# Patient Record
Sex: Female | Born: 1935 | Race: White | Hispanic: No
Health system: Southern US, Community
[De-identification: ages and names within clinical notes are randomized; demographics above are authoritative.]

## PROBLEM LIST (undated history)

## (undated) DIAGNOSIS — R600 Localized edema: Secondary | ICD-10-CM

## (undated) DIAGNOSIS — I4891 Unspecified atrial fibrillation: Secondary | ICD-10-CM

## (undated) DIAGNOSIS — I10 Essential (primary) hypertension: Secondary | ICD-10-CM

## (undated) DIAGNOSIS — I509 Heart failure, unspecified: Secondary | ICD-10-CM

## (undated) HISTORY — PX: APPENDECTOMY: SHX54

## (undated) HISTORY — PX: CHOLECYSTECTOMY: SHX55

## (undated) HISTORY — DX: Unspecified atrial fibrillation: I48.91

## (undated) HISTORY — PX: ABDOMINAL HYSTERECTOMY: SHX81

## (undated) HISTORY — PX: GLAUCOMA SURGERY: SHX656

## (undated) HISTORY — DX: Essential (primary) hypertension: I10

## (undated) HISTORY — DX: Localized edema: R60.0

---

## 1999-03-13 ENCOUNTER — Ambulatory Visit (HOSPITAL_COMMUNITY): Admission: RE | Admit: 1999-03-13 | Discharge: 1999-03-13 | Payer: Self-pay | Admitting: Cardiovascular Disease

## 1999-08-27 ENCOUNTER — Encounter: Admission: RE | Admit: 1999-08-27 | Discharge: 1999-10-22 | Payer: Self-pay | Admitting: Neurology

## 1999-09-24 ENCOUNTER — Ambulatory Visit (HOSPITAL_COMMUNITY): Admission: RE | Admit: 1999-09-24 | Discharge: 1999-09-24 | Payer: Self-pay | Admitting: Gastroenterology

## 1999-09-24 ENCOUNTER — Encounter (INDEPENDENT_AMBULATORY_CARE_PROVIDER_SITE_OTHER): Payer: Self-pay

## 1999-10-08 ENCOUNTER — Encounter: Payer: Self-pay | Admitting: Gastroenterology

## 1999-10-08 ENCOUNTER — Ambulatory Visit (HOSPITAL_COMMUNITY): Admission: RE | Admit: 1999-10-08 | Discharge: 1999-10-08 | Payer: Self-pay | Admitting: Gastroenterology

## 2001-09-13 ENCOUNTER — Other Ambulatory Visit: Admission: RE | Admit: 2001-09-13 | Discharge: 2001-09-13 | Payer: Self-pay | Admitting: Family Medicine

## 2008-06-12 HISTORY — PX: US ECHOCARDIOGRAPHY: HXRAD669

## 2010-06-21 ENCOUNTER — Ambulatory Visit: Payer: Self-pay | Admitting: Cardiovascular Disease

## 2010-07-11 ENCOUNTER — Ambulatory Visit: Payer: Self-pay | Admitting: Cardiovascular Disease

## 2010-09-24 ENCOUNTER — Ambulatory Visit: Payer: Self-pay | Admitting: Cardiovascular Disease

## 2010-12-25 ENCOUNTER — Ambulatory Visit (INDEPENDENT_AMBULATORY_CARE_PROVIDER_SITE_OTHER): Payer: Self-pay | Admitting: Cardiovascular Disease

## 2010-12-25 DIAGNOSIS — I119 Hypertensive heart disease without heart failure: Secondary | ICD-10-CM

## 2011-02-03 ENCOUNTER — Telehealth: Payer: Self-pay | Admitting: Cardiovascular Disease

## 2011-02-03 ENCOUNTER — Telehealth: Payer: Self-pay | Admitting: *Deleted

## 2011-02-03 NOTE — Telephone Encounter (Signed)
QUESTIONS ABOUT MEDICATIONS °

## 2011-02-03 NOTE — Telephone Encounter (Signed)
Pt thinks Losartan is causing depression and fatigue.  States this started when the Losartan was started.  She states she cannot take Metoprolol due to the same reasons, and also cannot tolerate Norvasc due to swelling.  RN will ask Dr. Elease Hashimoto for further eval.

## 2011-02-04 ENCOUNTER — Telehealth: Payer: Self-pay | Admitting: Cardiovascular Disease

## 2011-02-04 NOTE — Telephone Encounter (Signed)
Dr Elease Hashimoto to address med intolerance, losartan, metoprolol,and norvasc. Makes pt fatigued and depressed. Left msg for her to call and make app. # left on machine. jodette Elizaveta Mattice rn

## 2011-04-22 ENCOUNTER — Encounter: Payer: Self-pay | Admitting: Cardiovascular Disease

## 2011-10-14 ENCOUNTER — Other Ambulatory Visit: Payer: Self-pay | Admitting: Cardiovascular Disease

## 2011-10-23 NOTE — Telephone Encounter (Signed)
Fax Received. Refill Completed. Wendy Patterson (R.M.A)   

## 2011-10-23 NOTE — Telephone Encounter (Signed)
Hey, just a heads up, this patient refill has been on hold since 12/11.

## 2011-11-03 ENCOUNTER — Encounter: Payer: Self-pay | Admitting: Cardiovascular Disease

## 2011-11-03 ENCOUNTER — Encounter: Payer: Self-pay | Admitting: *Deleted

## 2011-11-18 ENCOUNTER — Encounter: Payer: Self-pay | Admitting: Cardiovascular Disease

## 2011-11-18 ENCOUNTER — Ambulatory Visit (INDEPENDENT_AMBULATORY_CARE_PROVIDER_SITE_OTHER): Payer: 59 | Admitting: Cardiovascular Disease

## 2011-11-18 VITALS — BP 133/55 | HR 68 | Ht 66.0 in | Wt 118.8 lb

## 2011-11-18 DIAGNOSIS — I10 Essential (primary) hypertension: Secondary | ICD-10-CM | POA: Insufficient documentation

## 2011-11-18 LAB — BASIC METABOLIC PANEL
CO2: 24 mEq/L (ref 19–32)
Chloride: 103 mEq/L (ref 96–112)
Creatinine, Ser: 0.9 mg/dL (ref 0.4–1.2)
Potassium: 3.5 mEq/L (ref 3.5–5.1)
Sodium: 137 mEq/L (ref 135–145)

## 2011-11-18 MED ORDER — CARVEDILOL 12.5 MG PO TABS: 12.5000 mg | ORAL_TABLET | Freq: Two times a day (BID) | ORAL | Status: DC

## 2011-11-18 MED ORDER — COREG 12.5 MG PO TABS: 12.5000 mg | ORAL_TABLET | Freq: Two times a day (BID) | ORAL | Status: DC

## 2011-11-18 NOTE — Patient Instructions (Addendum)
Your physician wants you to follow-up in: 3 MONTHS  You will receive a reminder letter in the mail two months in advance. If you don't receive a letter, please call our office to schedule the follow-up appointment.  Your physician recommends that you return for lab work in: TODAY BMP  STOP LOSARTAN START CARVEDILOL/COREG 12.5 MG ONE TABLET TWICE A DAY

## 2011-11-18 NOTE — Progress Notes (Signed)
    Wendall Mola Date of Birth  1931-03-31 [facility]     [place] Office  [address]    [address]   [address] [place], [place]  [address]    [place], [place]  [address] [phone]  Fax  [phone]  [phone]  Fax [phone]   History of Present Illness:  Wendy Patterson is a 78 yo with a hx of HTN.  She has had lots of fatigue - especially after starting the Losartan.  She even thinks her fatigue is worse than when she was on Metoprolol.  Current Outpatient Prescriptions on File Prior to Visit  Medication Sig Dispense Refill  . aspirin 325 MG tablet Take 325 mg by mouth daily.        . Brimonidine Tartrate (ALPHAGAN P OP) Apply to eye 2 (two) times daily. Left Eye      . bumetanide (BUMEX) 2 MG tablet Take 2 mg by mouth daily.        . Cyanocobalamin (VITAMIN B12 PO) Take by mouth daily.        Marland Kitchen estrogens, conjugated, (PREMARIN) 1.25 MG tablet Take 1.25 mg by mouth. Every Other Day      . Latanoprost (XALATAN OP) Apply to eye. Left Eye       . losartan (COZAAR) 100 MG tablet TAKE ONE (1) TABLET BY MOUTH EVERY DAY  30 tablet  0  . Pyridoxine HCl (VITAMIN B6 PO) Take by mouth daily.        . Vitamin D, Ergocalciferol, (DRISDOL) 50000 UNITS CAPS Take 50,000 Units by mouth. Every Other Week        Allergies  Allergen Reactions  . Amlodipine     Caused significant leg edema   . Oxycontin     Dizziness and nausea   . Sulfa Drugs Cross Reactors     Cause Asthma    Past Medical History  Diagnosis Date  . HTN (hypertension)   . Atrial fibrillation     Intermittent/atrial flutter  . Glaucoma   . Leg edema     Past Surgical History  Procedure Date  . Glaucoma surgery   . US echocardiography 06-12-2008    EF 55-60%    History  Smoking status  . Never Smoker   Smokeless tobacco  . Not on file    History  Alcohol Use: Not on file    No family history on file.  Reviw of Systems:  Reviewed in the HPI.  All other systems are  negative.  Physical Exam: BP 133/55  Pulse 68  Ht 5\' 6"  (1.676 m)  Wt 118 lb 12.8 oz (53.887 kg)  BMI 19.17 kg/m2 The patient is alert and oriented x 3.  The mood and affect are normal.   Skin: warm and dry.  Color is normal.    HEENT:   She has 2+ carotids. There is no JVD. Her neck is supple.  Lungs: Lungs are clear to auscultation.   Heart: Regular rate S1-S2.    Abdomen: Good bowel sounds. There is no hepatosplenomegaly.  Extremities:  No clubbing cyanosis or edema.  Neuro:  There exam is nonfocal    ECG: Normal sinus rhythm. She has normal EKG  Assessment / Plan:

## 2011-11-18 NOTE — Assessment & Plan Note (Signed)
Wendy Patterson complains of fatigue which may be due to the losartan. She seems to tolerate many medicines very poorly. We will start her back on the metoprolol 25 mg twice a day to see if that helps. I'll see her back in several months for followup visit.

## 2011-12-03 ENCOUNTER — Telehealth: Payer: Self-pay | Admitting: Cardiovascular Disease

## 2011-12-03 NOTE — Telephone Encounter (Signed)
Pt needs 11-18-11 lab work faxed to dr [employer] fax [phone], waiting on rx from his office and cant get it until he gets labs

## 2011-12-03 NOTE — Telephone Encounter (Signed)
Labs forwarded to pcp

## 2013-06-07 ENCOUNTER — Other Ambulatory Visit: Payer: Self-pay | Admitting: Family Medicine

## 2013-06-07 DIAGNOSIS — R141 Gas pain: Secondary | ICD-10-CM

## 2013-06-07 DIAGNOSIS — R1907 Generalized intra-abdominal and pelvic swelling, mass and lump: Secondary | ICD-10-CM

## 2013-06-07 DIAGNOSIS — R1900 Intra-abdominal and pelvic swelling, mass and lump, unspecified site: Secondary | ICD-10-CM

## 2013-06-07 DIAGNOSIS — R109 Unspecified abdominal pain: Secondary | ICD-10-CM

## 2013-06-13 ENCOUNTER — Other Ambulatory Visit: Payer: Self-pay | Admitting: Family Medicine

## 2013-06-13 ENCOUNTER — Other Ambulatory Visit: Payer: 59

## 2013-06-13 DIAGNOSIS — R143 Flatulence: Secondary | ICD-10-CM

## 2013-06-13 DIAGNOSIS — R1903 Right lower quadrant abdominal swelling, mass and lump: Secondary | ICD-10-CM

## 2013-06-14 ENCOUNTER — Ambulatory Visit
Admission: RE | Admit: 2013-06-14 | Discharge: 2013-06-14 | Disposition: A | Discharge status: Home / Self Care | Payer: 59 | Source: Ambulatory Visit | Attending: Family Medicine | Admitting: Family Medicine

## 2013-06-14 DIAGNOSIS — R143 Flatulence: Secondary | ICD-10-CM

## 2013-06-14 DIAGNOSIS — R1903 Right lower quadrant abdominal swelling, mass and lump: Secondary | ICD-10-CM

## 2014-01-06 ENCOUNTER — Other Ambulatory Visit: Payer: Self-pay | Admitting: Family Medicine

## 2014-01-06 DIAGNOSIS — M542 Cervicalgia: Secondary | ICD-10-CM

## 2014-01-06 DIAGNOSIS — S0990XA Unspecified injury of head, initial encounter: Secondary | ICD-10-CM

## 2014-02-22 ENCOUNTER — Ambulatory Visit: Payer: Medicare Other | Attending: Family Medicine

## 2014-02-22 DIAGNOSIS — IMO0001 Reserved for inherently not codable concepts without codable children: Secondary | ICD-10-CM | POA: Insufficient documentation

## 2014-02-22 DIAGNOSIS — R293 Abnormal posture: Secondary | ICD-10-CM | POA: Insufficient documentation

## 2014-02-22 DIAGNOSIS — R279 Unspecified lack of coordination: Secondary | ICD-10-CM | POA: Diagnosis not present

## 2014-02-27 ENCOUNTER — Ambulatory Visit: Payer: Medicare Other | Admitting: Physical Therapy

## 2014-02-27 DIAGNOSIS — IMO0001 Reserved for inherently not codable concepts without codable children: Secondary | ICD-10-CM | POA: Diagnosis not present

## 2014-03-01 ENCOUNTER — Ambulatory Visit: Payer: Medicare Other | Admitting: Physical Therapy

## 2014-03-01 DIAGNOSIS — IMO0001 Reserved for inherently not codable concepts without codable children: Secondary | ICD-10-CM | POA: Diagnosis not present

## 2014-03-06 ENCOUNTER — Ambulatory Visit: Payer: Medicare Other | Attending: Family Medicine | Admitting: Physical Therapy

## 2014-03-06 DIAGNOSIS — R293 Abnormal posture: Secondary | ICD-10-CM | POA: Diagnosis not present

## 2014-03-06 DIAGNOSIS — IMO0001 Reserved for inherently not codable concepts without codable children: Secondary | ICD-10-CM | POA: Diagnosis present

## 2014-03-06 DIAGNOSIS — R279 Unspecified lack of coordination: Secondary | ICD-10-CM | POA: Diagnosis not present

## 2014-03-09 ENCOUNTER — Ambulatory Visit: Payer: Medicare Other

## 2014-03-09 DIAGNOSIS — IMO0001 Reserved for inherently not codable concepts without codable children: Secondary | ICD-10-CM | POA: Diagnosis not present

## 2014-03-13 ENCOUNTER — Ambulatory Visit: Payer: Medicare Other

## 2014-03-13 DIAGNOSIS — IMO0001 Reserved for inherently not codable concepts without codable children: Secondary | ICD-10-CM | POA: Diagnosis not present

## 2014-03-17 ENCOUNTER — Ambulatory Visit: Payer: Medicare Other

## 2014-03-17 DIAGNOSIS — IMO0001 Reserved for inherently not codable concepts without codable children: Secondary | ICD-10-CM | POA: Diagnosis not present

## 2014-03-20 ENCOUNTER — Ambulatory Visit: Payer: Medicare Other

## 2014-03-20 DIAGNOSIS — IMO0001 Reserved for inherently not codable concepts without codable children: Secondary | ICD-10-CM | POA: Diagnosis not present

## 2014-03-23 ENCOUNTER — Ambulatory Visit: Payer: Medicare Other

## 2014-03-23 DIAGNOSIS — IMO0001 Reserved for inherently not codable concepts without codable children: Secondary | ICD-10-CM | POA: Diagnosis not present

## 2014-03-28 ENCOUNTER — Ambulatory Visit: Payer: Medicare Other

## 2014-03-28 DIAGNOSIS — IMO0001 Reserved for inherently not codable concepts without codable children: Secondary | ICD-10-CM | POA: Diagnosis not present

## 2014-03-31 ENCOUNTER — Ambulatory Visit: Payer: Medicare Other | Admitting: Physical Therapy

## 2014-03-31 DIAGNOSIS — IMO0001 Reserved for inherently not codable concepts without codable children: Secondary | ICD-10-CM | POA: Diagnosis not present

## 2014-04-04 ENCOUNTER — Ambulatory Visit: Payer: Medicare Other | Attending: Family Medicine

## 2014-04-04 DIAGNOSIS — R279 Unspecified lack of coordination: Secondary | ICD-10-CM | POA: Insufficient documentation

## 2014-04-04 DIAGNOSIS — R293 Abnormal posture: Secondary | ICD-10-CM | POA: Diagnosis not present

## 2014-04-04 DIAGNOSIS — IMO0001 Reserved for inherently not codable concepts without codable children: Secondary | ICD-10-CM | POA: Diagnosis present

## 2014-04-07 ENCOUNTER — Ambulatory Visit: Payer: Medicare Other | Admitting: Physical Therapy

## 2014-04-07 DIAGNOSIS — IMO0001 Reserved for inherently not codable concepts without codable children: Secondary | ICD-10-CM | POA: Diagnosis not present

## 2014-04-11 ENCOUNTER — Ambulatory Visit: Payer: Medicare Other | Admitting: Physical Therapy

## 2014-04-11 DIAGNOSIS — IMO0001 Reserved for inherently not codable concepts without codable children: Secondary | ICD-10-CM | POA: Diagnosis not present

## 2014-04-14 ENCOUNTER — Ambulatory Visit: Payer: Medicare Other | Admitting: Physical Therapy

## 2014-04-14 DIAGNOSIS — IMO0001 Reserved for inherently not codable concepts without codable children: Secondary | ICD-10-CM | POA: Diagnosis not present

## 2014-04-19 ENCOUNTER — Ambulatory Visit: Payer: Medicare Other

## 2014-04-19 ENCOUNTER — Ambulatory Visit: Payer: 59 | Admitting: Physical Therapy

## 2014-04-19 DIAGNOSIS — IMO0001 Reserved for inherently not codable concepts without codable children: Secondary | ICD-10-CM | POA: Diagnosis not present

## 2014-04-21 ENCOUNTER — Ambulatory Visit: Payer: Medicare Other | Admitting: Physical Therapy

## 2014-04-21 DIAGNOSIS — IMO0001 Reserved for inherently not codable concepts without codable children: Secondary | ICD-10-CM | POA: Diagnosis not present

## 2015-04-27 DIAGNOSIS — K219 Gastro-esophageal reflux disease without esophagitis: Secondary | ICD-10-CM | POA: Insufficient documentation

## 2015-04-27 DIAGNOSIS — I4891 Unspecified atrial fibrillation: Secondary | ICD-10-CM | POA: Insufficient documentation

## 2016-06-20 ENCOUNTER — Other Ambulatory Visit: Payer: Self-pay | Admitting: Family Medicine

## 2016-06-20 ENCOUNTER — Ambulatory Visit
Admission: RE | Admit: 2016-06-20 | Discharge: 2016-06-20 | Disposition: A | Discharge status: Home / Self Care | Payer: Medicare Other | Source: Ambulatory Visit | Attending: Family Medicine | Admitting: Family Medicine

## 2016-06-20 DIAGNOSIS — R0609 Other forms of dyspnea: Principal | ICD-10-CM

## 2016-07-01 ENCOUNTER — Other Ambulatory Visit (HOSPITAL_COMMUNITY): Payer: Self-pay

## 2016-07-15 ENCOUNTER — Ambulatory Visit (HOSPITAL_COMMUNITY): Payer: Medicare Other | Attending: Cardiology

## 2016-07-15 ENCOUNTER — Other Ambulatory Visit: Payer: Self-pay

## 2016-07-15 ENCOUNTER — Other Ambulatory Visit: Payer: Self-pay | Admitting: Family Medicine

## 2016-07-15 DIAGNOSIS — I340 Nonrheumatic mitral (valve) insufficiency: Secondary | ICD-10-CM | POA: Diagnosis not present

## 2016-07-15 DIAGNOSIS — I119 Hypertensive heart disease without heart failure: Secondary | ICD-10-CM | POA: Insufficient documentation

## 2016-07-15 DIAGNOSIS — I071 Rheumatic tricuspid insufficiency: Secondary | ICD-10-CM | POA: Insufficient documentation

## 2016-07-15 DIAGNOSIS — I351 Nonrheumatic aortic (valve) insufficiency: Secondary | ICD-10-CM | POA: Insufficient documentation

## 2016-07-15 DIAGNOSIS — R0600 Dyspnea, unspecified: Secondary | ICD-10-CM | POA: Diagnosis present

## 2016-07-15 DIAGNOSIS — R0609 Other forms of dyspnea: Principal | ICD-10-CM

## 2016-08-21 ENCOUNTER — Encounter: Payer: Self-pay | Admitting: Internal Medicine

## 2016-08-21 ENCOUNTER — Other Ambulatory Visit (INDEPENDENT_AMBULATORY_CARE_PROVIDER_SITE_OTHER): Payer: Medicare Other

## 2016-08-21 ENCOUNTER — Ambulatory Visit (INDEPENDENT_AMBULATORY_CARE_PROVIDER_SITE_OTHER): Payer: Medicare Other | Admitting: Internal Medicine

## 2016-08-21 VITALS — BP 138/82 | HR 90 | Ht 63.0 in | Wt 105.4 lb

## 2016-08-21 DIAGNOSIS — J309 Allergic rhinitis, unspecified: Secondary | ICD-10-CM

## 2016-08-21 DIAGNOSIS — R0602 Shortness of breath: Secondary | ICD-10-CM | POA: Insufficient documentation

## 2016-08-21 LAB — CBC WITH DIFFERENTIAL/PLATELET
BASOS PCT: 1.1 % (ref 0.0–3.0)
Basophils Absolute: 0.1 10*3/uL (ref 0.0–0.1)
EOS PCT: 1 % (ref 0.0–5.0)
Eosinophils Absolute: 0.1 10*3/uL (ref 0.0–0.7)
HEMATOCRIT: 35.9 % — AB (ref 36.0–46.0)
HEMOGLOBIN: 12.1 g/dL (ref 12.0–15.0)
Lymphocytes Relative: 20.6 % (ref 12.0–46.0)
Lymphs Abs: 1.8 10*3/uL (ref 0.7–4.0)
MCHC: 33.8 g/dL (ref 30.0–36.0)
MCV: 101.5 fl — ABNORMAL HIGH (ref 78.0–100.0)
MONO ABS: 0.5 10*3/uL (ref 0.1–1.0)
Monocytes Relative: 6.3 % (ref 3.0–12.0)
NEUTROS ABS: 6.2 10*3/uL (ref 1.4–7.7)
Neutrophils Relative %: 71 % (ref 43.0–77.0)
PLATELETS: 179 10*3/uL (ref 150.0–400.0)
RBC: 3.54 Mil/uL — ABNORMAL LOW (ref 3.87–5.11)
RDW: 15.7 % — AB (ref 11.5–15.5)
WBC: 8.7 10*3/uL (ref 4.0–10.5)

## 2016-08-21 LAB — BASIC METABOLIC PANEL
BUN: 28 mg/dL — AB (ref 6–23)
CHLORIDE: 99 meq/L (ref 96–112)
CO2: 25 meq/L (ref 19–32)
CREATININE: 1.08 mg/dL (ref 0.40–1.20)
Calcium: 8.4 mg/dL (ref 8.4–10.5)
GFR: 51.46 mL/min — ABNORMAL LOW (ref >60.00)
GLUCOSE: 107 mg/dL — AB (ref 70–99)
Potassium: 4.2 mEq/L (ref 3.5–5.1)
Sodium: 134 mEq/L — ABNORMAL LOW (ref 135–145)

## 2016-08-21 LAB — HEPATIC FUNCTION PANEL
ALT: 12 U/L (ref 0–35)
AST: 27 U/L (ref 0–37)
Albumin: 3.8 g/dL (ref 3.5–5.2)
Alkaline Phosphatase: 68 U/L (ref 39–117)
BILIRUBIN TOTAL: 0.5 mg/dL (ref 0.2–1.2)
Bilirubin, Direct: 0.1 mg/dL (ref 0.0–0.3)
TOTAL PROTEIN: 7.9 g/dL (ref 6.0–8.3)

## 2016-08-21 LAB — TSH: TSH: 2.07 u[IU]/mL (ref 0.35–4.50)

## 2016-08-21 LAB — BRAIN NATRIURETIC PEPTIDE: Pro B Natriuretic peptide (BNP): 964 pg/mL — ABNORMAL HIGH (ref 0.0–100.0)

## 2016-08-21 LAB — D-DIMER, QUANTITATIVE: D-Dimer, Quant: 0.75 mcg/mL FEU — ABNORMAL HIGH (ref <0.50)

## 2016-08-21 NOTE — Progress Notes (Signed)
Subjective:    Patient ID: Wendy Patterson, female    DOB: 04/13/1931    MRN: [id number]  HPI  [age] yowf never smoker first developed upper airway allergies seasonally in her in 1950's first symptoms and shots 1971 and last on them in 2011 by Kozlow but persistent spring = fall itchy sneezing  Runny nose but never rx inhalers referred to pulmonary clinic 08/21/2016 by Dr   Jeannetta NapElkins with doe x 2015   08/21/2016 1st [facility] office visit/ Remus Hagedorn   Chief Complaint  Patient presents with  . PULMONARY CONSULT    Abnormal Cleda DaubSpiro, referred by Dr Jeannetta NapElkins. Pt c/o SOB x 1-2 years; worsening. Severe ankle/feet swelling/blue in color for several months. Pt denies pain in legs from swelling.   indolent onset of doe x 2 years progressively worse but never present sitting or sleeping  Assoc fatigue and bilateral leg swelling L > R x " long time" before the sob but worse since onset of sob New L cp x sev months  Occurs transiently usually  with sitting and not walking/ not pleuritic /not supine  Last time able to walk at  HT x one month prior to OV     No obvious day to day or daytime variability or assoc excess/ purulent sputum or mucus plugs or hemoptysis or   chest tightness, subjective wheeze or overt sinus or hb symptoms. No unusual exp hx or h/o childhood pna/ asthma or knowledge of premature birth.  Sleeping ok without nocturnal  or early am exacerbation  of respiratory  c/o's or need for noct saba. Also denies any obvious fluctuation of symptoms with weather or environmental changes or other aggravating or alleviating factors except as outlined above   Current Medications, Allergies, Complete Past Medical History, Past Surgical History, Family History, and Social History were reviewed in [facility] electronic medical record.  ROS  The following are not active complaints unless bolded sore throat, dysphagia, dental problems, itching, sneezing,  nasal congestion or excess/ purulent secretions,  ear ache,   fever, chills, sweats, unintended wt loss, classically pleuritic or exertional cp,  orthopnea pnd or leg swelling, presyncope, palpitations, abdominal pain, anorexia, nausea, vomiting, diarrhea  or change in bowel or bladder habits, change in stools or urine, dysuria,hematuria,  rash, arthralgias, visual complaints, headache, numbness, weakness or ataxia or problems with walking -(unsteady and walks with knees flexed)  or coordination,  change in mood/affect or memory.           Review of Systems  Constitutional: Negative for fever and unexpected weight change.  HENT: Positive for congestion, postnasal drip, rhinorrhea and sinus pressure ( little). Negative for dental problem, ear pain, nosebleeds, sneezing, sore throat and trouble swallowing.   Eyes: Negative for redness and itching.  Respiratory: Positive for shortness of breath and wheezing. Negative for cough and chest tightness.   Cardiovascular: Positive for chest pain ( left sided pain in ribs) and leg swelling ( severe ankle/feet swelling). Negative for palpitations.  Gastrointestinal: Negative for nausea and vomiting.  Genitourinary: Negative for dysuria.  Musculoskeletal: Negative for joint swelling.  Skin: Negative for rash.  Neurological: Negative for headaches.  Hematological: Does not bruise/bleed easily.  Psychiatric/Behavioral: Negative for dysphoric mood. The patient is not nervous/anxious.        Objective:   Physical Exam  amb thin wf nad needs assistance to get out of chair and onto exam table s apparent sob   Wt Readings from Last 3 Encounters:  08/21/16  105 lb 6.4 oz (47.8 kg)  11/18/11 118 lb 12.8 oz (53.9 kg)    Vital signs reviewed   HEENT: nl dentition, turbinates, and oropharynx. Nl external ear canals without cough reflex   NECK :  without JVD/Nodes/TM/ nl carotid upstrokes bilaterally   LUNGS: no acc muscle use,  Nl contour chest which is clear to A and P bilaterally without cough on  insp or exp maneuvers   CV:  RRR  no s3 or murmur or increase in P2,  2+ bilateral lower ext pitting edema   ABD:  soft and nontender with nl inspiratory excursion in the supine position. No bruits or organomegaly, bowel sounds nl  MS:  Nl gait/ ext warm without deformities, calf tenderness, cyanosis or clubbing No obvious joint restrictions   SKIN: warm and dry without lesions    NEURO:  alert, approp, nl sensorium with  no motor deficits     I personally reviewed images and agree with radiology impression as follows:  CXR:   06/20/16 The heart size and mediastinal contours are within normal limits. Both lungs are clear. The visualized skeletal structures are unremarkable. Calcified granuloma is noted in the left upper lobe. Mild aortic calcifications are seen   Labs ordered/ reviewed:      Chemistry      Component Value Date/Time   NA 134 (L) 08/21/2016 1214   K 4.2 08/21/2016 1214   CL 99 08/21/2016 1214   CO2 25 08/21/2016 1214   BUN 28 (H) 08/21/2016 1214   CREATININE 1.08 08/21/2016 1214      Component Value Date/Time   CALCIUM 8.4 08/21/2016 1214   ALKPHOS 68 08/21/2016 1214   AST 27 08/21/2016 1214   ALT 12 08/21/2016 1214   BILITOT 0.5 08/21/2016 1214        Lab Results  Component Value Date   WBC 8.7 08/21/2016   HGB 12.1 08/21/2016   HCT 35.9 (L) 08/21/2016   MCV 101.5 (H) 08/21/2016   PLT 179.0 08/21/2016     Lab Results  Component Value Date   DDIMER 0.75 (H) 08/21/2016      Lab Results  Component Value Date   TSH 2.07 08/21/2016     Lab Results  Component Value Date   PROBNP 964.0 (H) 08/21/2016             Assessment & Plan:

## 2016-08-21 NOTE — Patient Instructions (Addendum)
Please remember to go to the lab   department downstairs for your tests - we will call you with the results when they are available  Please schedule a follow up office visit in 2 weeks, sooner if needed with pfts at [facility]

## 2016-08-22 ENCOUNTER — Encounter: Payer: Self-pay | Admitting: Internal Medicine

## 2016-08-22 DIAGNOSIS — J309 Allergic rhinitis, unspecified: Secondary | ICD-10-CM | POA: Insufficient documentation

## 2016-08-22 LAB — RESPIRATORY ALLERGY PROFILE REGION II ~~LOC~~
ALLERGEN, C. HERBARUM, M2: 0.16 kU/L — AB
ALLERGEN, COTTONWOOD, T14: 0.65 kU/L — AB
ALLERGEN, D PTERNOYSSINUS, D1: 5.29 kU/L — AB
ALLERGEN, MULBERRY, T70: 0.58 kU/L — AB
Allergen, A. alternata, m6: 0.25 kU/L — ABNORMAL HIGH
Allergen, Cedar tree, t12: 0.46 kU/L — ABNORMAL HIGH
Allergen, Comm Silver Birch, t9: 0.42 kU/L — ABNORMAL HIGH
Allergen, Mouse Urine Protein, e78: <0.1 kU/L
Allergen, Oak,t7: 1.01 kU/L — ABNORMAL HIGH
Allergen, P. notatum, m1: 0.14 kU/L — ABNORMAL HIGH
Aspergillus fumigatus, m3: 0.13 kU/L — ABNORMAL HIGH
BERMUDA GRASS: 2.22 kU/L — AB
Box Elder IgE: 0.99 kU/L — ABNORMAL HIGH
COCKROACH: 1.49 kU/L — AB
Cat Dander: <0.1 kU/L
Common Ragweed: 1.21 kU/L — ABNORMAL HIGH
D. FARINAE: 17.9 kU/L — AB
DOG DANDER: 0.17 kU/L — AB
ELM IGE: 1.08 kU/L — AB
IgE (Immunoglobulin E), Serum: 2768 kU/L — ABNORMAL HIGH (ref <115)
JOHNSON GRASS: 1.18 kU/L — AB
Pecan/Hickory Tree IgE: 0.92 kU/L — ABNORMAL HIGH
Rough Pigweed  IgE: 0.98 kU/L — ABNORMAL HIGH
Sheep Sorrel IgE: 1.89 kU/L — ABNORMAL HIGH
TIMOTHY GRASS: 1.24 kU/L — AB

## 2016-08-22 NOTE — Progress Notes (Signed)
LMTCB

## 2016-08-22 NOTE — Assessment & Plan Note (Signed)
Allergy profile 08/21/2016 >  Eos 0.1 /  IgE  Pending  She is at risk of asthma since she has an atopic background and using timoptic eyedrops but no clinical evidence to support it > rec restart flonase and return for pfts before and after saba

## 2016-08-22 NOTE — Progress Notes (Signed)
Spoke with pt and notified of results per Dr. Wert. Pt verbalized understanding and denied any questions. 

## 2016-08-22 NOTE — Assessment & Plan Note (Addendum)
-    Spirometry   (Dr Jeannetta NapElkins office dated 07/20/79 but birthdate listed as [id number] and age 82 = 2015)  FEV1 1.09  (67%)  Ratio 72 with minimal curvature   2d Echo 07/15/16 - Normal LV systolic function; grade 1 diastolic dysfunction with   elevated LV filling pressure; trace AI; mild MR and TR; mildly   elevated pulmonary pressure. - 08/21/2016   Walked RA x one lap @ 185 stopped due to  Light headed, walked slowly with knees flexed, unsteady gait, sats 98% at end    Symptoms are markedly disproportionate to objective findings and not clear this is a lung problem but pt does appear to have difficult airway management issues. DDX of  difficult airways management almost all start with A and  include Adherence, Ace Inhibitors, Acid Reflux, Active Sinus Disease, Alpha 1 Antitripsin deficiency, Anxiety masquerading as Airways dz,  ABPA,  Allergy(esp in young), Aspiration (esp in elderly), Adverse effects of meds,  Active smokers, A bunch of PE's (a small clot burden can't cause this syndrome unless there is already severe underlying pulm or vascular dz with poor reserve) plus two Bs  = Bronchiectasis and Beta blocker use..and one C= CHF  ?allergy/asthma > unlikely, check allergy profile and repeat pfts before and after pfts  ?a bunch of pe's > D dimer nl - while  A nl or even high nl value(age adjusted hers is high nl)   may miss small peripheral pe, the clot burden with sob is moderately high and the d dimer has a very high neg pred value in this setting    ? BB : note on timoptic nasal spray  ? CHF > diastolic dysfunction likely contributing   Based on Dr Hennie DuosElkin's study, which I believe has a Ship brokercomputer glitch on date, she has not significant airflow obst nor could be reproduce doe on walking study today.  I am most concerned about how unsteady she is and not than many elderly walk with flexed knees to help with stability but then rapidly tire out due to quadriceps fatigue and perceive it as sob.  Also the  elevation of the bnp suggest despite "reassuring" echo that there is significant vol overload contributing to her edema and sense of doe but defer rx to Dr Jeannetta NapElkins

## 2016-09-19 ENCOUNTER — Ambulatory Visit (HOSPITAL_COMMUNITY)
Admission: RE | Admit: 2016-09-19 | Discharge: 2016-09-19 | Disposition: A | Discharge status: Home / Self Care | Payer: Medicare Other | Source: Ambulatory Visit | Attending: Internal Medicine | Admitting: Internal Medicine

## 2016-09-19 ENCOUNTER — Ambulatory Visit (INDEPENDENT_AMBULATORY_CARE_PROVIDER_SITE_OTHER): Payer: Medicare Other | Admitting: Internal Medicine

## 2016-09-19 ENCOUNTER — Encounter: Payer: Self-pay | Admitting: Internal Medicine

## 2016-09-19 VITALS — BP 122/68 | HR 101 | Ht 63.0 in | Wt 105.0 lb

## 2016-09-19 DIAGNOSIS — J309 Allergic rhinitis, unspecified: Secondary | ICD-10-CM | POA: Diagnosis not present

## 2016-09-19 DIAGNOSIS — R0602 Shortness of breath: Secondary | ICD-10-CM | POA: Diagnosis not present

## 2016-09-19 LAB — PULMONARY FUNCTION TEST
DL/VA % pred: 72 %
DL/VA: 3.32 ml/min/mmHg/L
DLCO UNC % PRED: 46 %
DLCO UNC: 10.39 ml/min/mmHg
FEF 25-75 PRE: 0.59 L/s
FEF 25-75 Post: 1.11 L/sec
FEF2575-%Change-Post: 88 %
FEF2575-%Pred-Post: 96 %
FEF2575-%Pred-Pre: 51 %
FEV1-%CHANGE-POST: 8 %
FEV1-%PRED-POST: 84 %
FEV1-%Pred-Pre: 78 %
FEV1-POST: 1.43 L
FEV1-Pre: 1.32 L
FEV1FVC-%Change-Post: 0 %
FEV1FVC-%Pred-Pre: 94 %
FEV6-%CHANGE-POST: 8 %
FEV6-%PRED-POST: 97 %
FEV6-%Pred-Pre: 89 %
FEV6-POST: 2.08 L
FEV6-PRE: 1.91 L
FEV6FVC-%PRED-POST: 106 %
FEV6FVC-%PRED-PRE: 106 %
FVC-%CHANGE-POST: 8 %
FVC-%Pred-Post: 91 %
FVC-%Pred-Pre: 83 %
FVC-Post: 2.08 L
FVC-Pre: 1.91 L
POST FEV6/FVC RATIO: 100 %
PRE FEV6/FVC RATIO: 100 %
Post FEV1/FVC ratio: 69 %
Pre FEV1/FVC ratio: 69 %
RV % PRED: 219 %
RV: 5.22 L
TLC % PRED: 147 %
TLC: 7.12 L

## 2016-09-19 MED ORDER — AZELASTINE-FLUTICASONE 137-50 MCG/ACT NA SUSP: 1.0000 | Freq: Two times a day (BID) | NASAL | 11 refills | Status: AC

## 2016-09-19 MED ORDER — ALBUTEROL SULFATE (2.5 MG/3ML) 0.083% IN NEBU
2.5000 mg | INHALATION_SOLUTION | Freq: Once | RESPIRATORY_TRACT | Status: AC
  Administered 2016-09-19: 2.5 mg via RESPIRATORY_TRACT

## 2016-09-19 NOTE — Patient Instructions (Addendum)
Stop flonase and try dymista one twice daily each nostril   Also as needed can try clariton or allegra for nasal symptoms but if not responding would see Dr Lucie LeatherKozlow again for re-evaluation  I will write Dr Jeannetta NapElkins to let him know about your heart pressures but nothing to do for now  Pulmonary follow up is as needed

## 2016-09-19 NOTE — Progress Notes (Signed)
Subjective:    Patient ID: Wendy Patterson, female    DOB: 11/13/1930    MRN: [id number]    Brief patient profile:  [age] yowf  never smoker first developed upper airway allergies seasonally in her in 1950's first symptoms and shots 1971 and last on them in 2011 by Kozlow but persistent spring = fall itchy sneezing  Runny nose but never rx inhalers referred to pulmonary clinic 08/21/2016 by Dr   Jeannetta NapElkins with doe x 2015     History of Present Illness  08/21/2016 [facility] office visit/ Johnathin Vanderschaaf   Chief Complaint  Patient presents with  . PULMONARY CONSULT    Abnormal Cleda DaubSpiro, referred by Dr Jeannetta NapElkins. Pt c/o SOB x 1-2 years; worsening. Severe ankle/feet swelling/blue in color for several months. Pt denies pain in legs from swelling.   indolent onset of doe x 2 years progressively worse but never present sitting or sleeping  Assoc fatigue and bilateral leg swelling L > R x " long time" before the sob but worse since onset of sob New L cp x sev months  Occurs transiently usually  with sitting and not walking/ not pleuritic /not supine  Last time able to walk at  HT x one month prior to OV   rec Please schedule a follow up office visit in 2 weeks, sooner if needed with pfts [facility]     09/19/2016  f/u ov/Corian Handley re: unexplained sob  Chief Complaint  Patient presents with  . Follow-up    Breathing has not improved. No new co's today.    main concerns are persistent pnds no better previously with allergy eval by Dr Lucie LeatherKozlow but has not retuned   using flonase on nasal symptoms > no better  Denies any relation between severity of doe and leg swelling but both came on about the same time   No obvious day to day or daytime variability or assoc excess/ purulent sputum or mucus plugs or hemoptysis or   chest tightness, subjective wheeze or overt   hb symptoms. No unusual exp hx or h/o childhood pna/ asthma or knowledge of premature birth.  Sleeping ok without nocturnal  or early am  exacerbation  of respiratory  c/o's or need for noct saba. Also denies any obvious fluctuation of symptoms with weather or environmental changes or other aggravating or alleviating factors except as outlined above   Current Medications, Allergies, Complete Past Medical History, Past Surgical History, Family History, and Social History were reviewed in [facility] electronic medical record.  ROS  The following are not active complaints unless bolded sore throat, dysphagia, dental problems, itching, sneezing,  nasal congestion or excess/ purulent secretions, ear ache,   fever, chills, sweats, unintended wt loss, classically pleuritic or exertional cp,  orthopnea pnd or leg swelling, presyncope, palpitations, abdominal pain, anorexia, nausea, vomiting, diarrhea  or change in bowel or bladder habits, change in stools or urine, dysuria,hematuria,  rash, arthralgias, visual complaints, headache, numbness, weakness or ataxia or problems with walking -(unsteady and walks with knees flexed)  or coordination,  change in mood/affect or memory.                 Objective:   Physical Exam  amb thin wf nad needs assistance to get out of chair and onto exam table s apparent sob   Wt Readings from Last 3 Encounters:  09/19/16 105 lb (47.6 kg)  08/21/16 105 lb 6.4 oz (47.8 kg)  11/18/11 118 lb  12.8 oz (53.9 kg)    Vital signs reviewed - Note on arrival 02 sats  97% on RA      HEENT: nl dentition, turbinates, and oropharynx. Nl external ear canals without cough reflex   NECK :  without JVD/Nodes/TM/ nl carotid upstrokes bilaterally   LUNGS: no acc muscle use,  Nl contour chest which is clear to A and P bilaterally without cough on insp or exp maneuvers   CV:  RRR  no s3 or murmur or increase in P2,  2+ bilateral lower ext pitting edema / sym ABD:  soft and nontender with nl inspiratory excursion in the supine position. No bruits or organomegaly, bowel sounds nl  MS:  Nl gait/ ext warm without  deformities, calf tenderness, cyanosis or clubbing No obvious joint restrictions   SKIN: warm and dry without lesions    NEURO:  alert, approp, nl sensorium with  no motor deficits     I personally reviewed images and agree with radiology impression as follows:  CXR:   06/20/16 The heart size and mediastinal contours are within normal limits. Both lungs are clear. The visualized skeletal structures are unremarkable. Calcified granuloma is noted in the left upper lobe. Mild aortic calcifications are seen    Labs  reviewed:    Chemistry      Component Value Date/Time   NA 134 (L) 08/21/2016 1214   K 4.2 08/21/2016 1214   CL 99 08/21/2016 1214   CO2 25 08/21/2016 1214   BUN 28 (H) 08/21/2016 1214   CREATININE 1.08 08/21/2016 1214      Component Value Date/Time   CALCIUM 8.4 08/21/2016 1214   ALKPHOS 68 08/21/2016 1214   AST 27 08/21/2016 1214   ALT 12 08/21/2016 1214   BILITOT 0.5 08/21/2016 1214     Albumin    3.8                                     08/21/16      Lab Results  Component Value Date   WBC 8.7 08/21/2016   HGB 12.1 08/21/2016   HCT 35.9 (L) 08/21/2016   MCV 101.5 (H) 08/21/2016   PLT 179.0 08/21/2016       Eos                         0.1                                                                   08/21/16     Lab Results  Component Value Date   TSH 2.07 08/21/2016     Lab Results  Component Value Date   PROBNP 964.0 (H) 08/21/2016             Assessment & Plan:

## 2016-09-20 NOTE — Assessment & Plan Note (Signed)
Allergy profile 08/21/2016 >  Eos 0.1 /  IgE 2768 multiple POS RAST  > refer to Kozlow - trial of dymista one bid 09/19/2016

## 2016-09-20 NOTE — Assessment & Plan Note (Signed)
-    Spirometry   (Dr Jeannetta NapElkins office dated 07/20/79 but birthdate listed as [id number] and age 82 = 2015)  FEV1 1.09  (67%)  Ratio 72 with minimal curvature   2d Echo 07/15/16 - Normal LV systolic function; grade 1 diastolic dysfunction with   elevated LV filling pressure; trace AI; mild MR and TR; mildly   elevated pulmonary pressure. - 08/21/2016   Walked RA x one lap @ 185 stopped due to  Light headed, walked slowly with knees flexed, unsteady gait, sats 98% at end - BNP  08/21/16  = 964   - PFT's  09/19/2016  FEV1 1.43 (84 % ) ratio 69  p 8 % improvement from saba p nothing prior to study with DLCO  46 % corrects to 72 % for alv volume      I had an extended final summary discussion with the patient and daughter reviewing all relevant studies completed to date and  lasting 15 to 20 minutes of a 25 minute visit on the following issues:    1) we have not been able to verify here that she is truly limited by breathing from the level of activity she descibes that makes her sob, but clearly it is not related to ventilatory or resp issues with the the dlco low but the sats fine with her level of activity and I would not pursue further pulmonary eval/ rx at this point  2) she is focused on pnds for which she has already seen Dr Lucie LeatherKozlow (see separate a/p)   3) her main problem appears to be cardiac/vol overload based on bnp and echo findings as well as by significant peripheral edema.  I would rec adding aldactone and very low doses of corevidol here but defer this entirely to Dr Jeannetta NapElkins with cards f/u prn  4) Each maintenance medication was reviewed in detail including most importantly the difference between maintenance and as needed and under what circumstances the prns are to be used.  Please see instructions for details which were reviewed in writing and the patient given a copy.

## 2017-03-04 ENCOUNTER — Ambulatory Visit
Admission: RE | Admit: 2017-03-04 | Discharge: 2017-03-04 | Disposition: A | Discharge status: Home / Self Care | Payer: Medicare Other | Source: Ambulatory Visit | Attending: Family Medicine | Admitting: Family Medicine

## 2017-03-04 ENCOUNTER — Other Ambulatory Visit: Payer: Self-pay | Admitting: Family Medicine

## 2017-03-04 DIAGNOSIS — R0602 Shortness of breath: Secondary | ICD-10-CM

## 2017-03-04 MED ORDER — IOPAMIDOL (ISOVUE-370) INJECTION 76%
80.0000 mL | Freq: Once | INTRAVENOUS | Status: AC | PRN
  Administered 2017-03-04: 80 mL via INTRAVENOUS

## 2017-03-13 ENCOUNTER — Other Ambulatory Visit: Payer: Self-pay | Admitting: Physician Assistant

## 2017-03-13 DIAGNOSIS — R634 Abnormal weight loss: Secondary | ICD-10-CM

## 2017-03-13 DIAGNOSIS — R195 Other fecal abnormalities: Secondary | ICD-10-CM

## 2017-03-13 DIAGNOSIS — D649 Anemia, unspecified: Secondary | ICD-10-CM

## 2017-03-13 DIAGNOSIS — R1030 Lower abdominal pain, unspecified: Secondary | ICD-10-CM

## 2017-03-16 ENCOUNTER — Ambulatory Visit
Admission: RE | Admit: 2017-03-16 | Discharge: 2017-03-16 | Disposition: A | Discharge status: Home / Self Care | Payer: Medicare Other | Source: Ambulatory Visit | Attending: Physician Assistant | Admitting: Physician Assistant

## 2017-03-16 DIAGNOSIS — R195 Other fecal abnormalities: Secondary | ICD-10-CM

## 2017-03-16 DIAGNOSIS — R634 Abnormal weight loss: Secondary | ICD-10-CM

## 2017-03-16 DIAGNOSIS — D649 Anemia, unspecified: Secondary | ICD-10-CM

## 2017-03-16 DIAGNOSIS — R1030 Lower abdominal pain, unspecified: Secondary | ICD-10-CM

## 2017-03-16 MED ORDER — IOPAMIDOL (ISOVUE-300) INJECTION 61%
100.0000 mL | Freq: Once | INTRAVENOUS | Status: AC | PRN
  Administered 2017-03-16: 100 mL via INTRAVENOUS

## 2017-04-08 ENCOUNTER — Other Ambulatory Visit: Payer: Self-pay | Admitting: Family Medicine

## 2017-04-08 ENCOUNTER — Ambulatory Visit
Admission: RE | Admit: 2017-04-08 | Discharge: 2017-04-08 | Disposition: A | Discharge status: Home / Self Care | Payer: Medicare Other | Source: Ambulatory Visit | Attending: Family Medicine | Admitting: Family Medicine

## 2017-04-08 DIAGNOSIS — R52 Pain, unspecified: Secondary | ICD-10-CM

## 2017-05-18 ENCOUNTER — Other Ambulatory Visit: Payer: Self-pay | Admitting: Physical Medicine and Rehabilitation

## 2017-05-18 DIAGNOSIS — M542 Cervicalgia: Secondary | ICD-10-CM

## 2017-05-27 ENCOUNTER — Other Ambulatory Visit: Payer: Self-pay | Admitting: Physical Medicine and Rehabilitation

## 2017-05-27 DIAGNOSIS — M50821 Other cervical disc disorders at C4-C5 level: Secondary | ICD-10-CM

## 2017-06-16 ENCOUNTER — Ambulatory Visit
Admission: RE | Admit: 2017-06-16 | Discharge: 2017-06-16 | Disposition: A | Discharge status: Home / Self Care | Payer: Medicare Other | Source: Ambulatory Visit | Attending: Physical Medicine and Rehabilitation | Admitting: Physical Medicine and Rehabilitation

## 2017-06-16 DIAGNOSIS — M50821 Other cervical disc disorders at C4-C5 level: Secondary | ICD-10-CM

## 2017-06-16 MED ORDER — TRIAMCINOLONE ACETONIDE 40 MG/ML IJ SUSP (RADIOLOGY)
60.0000 mg | Freq: Once | INTRAMUSCULAR | Status: AC
  Administered 2017-06-16: 60 mg via EPIDURAL

## 2017-06-16 MED ORDER — IOPAMIDOL (ISOVUE-M 300) INJECTION 61%
1.0000 mL | Freq: Once | INTRAMUSCULAR | Status: AC | PRN
Start: 2017-06-16 — End: 2017-06-16
  Administered 2017-06-16: 1 mL via EPIDURAL

## 2017-06-16 NOTE — Discharge Instructions (Signed)

## 2017-08-27 ENCOUNTER — Other Ambulatory Visit: Payer: Self-pay | Admitting: Physical Medicine and Rehabilitation

## 2017-08-27 DIAGNOSIS — M50822 Other cervical disc disorders at C5-C6 level: Secondary | ICD-10-CM

## 2017-09-04 ENCOUNTER — Ambulatory Visit
Admission: RE | Admit: 2017-09-04 | Discharge: 2017-09-04 | Disposition: A | Discharge status: Home / Self Care | Payer: Medicare Other | Source: Ambulatory Visit | Attending: Physical Medicine and Rehabilitation | Admitting: Physical Medicine and Rehabilitation

## 2017-09-04 DIAGNOSIS — M50822 Other cervical disc disorders at C5-C6 level: Secondary | ICD-10-CM

## 2017-09-04 MED ORDER — TRIAMCINOLONE ACETONIDE 40 MG/ML IJ SUSP (RADIOLOGY)
60.0000 mg | Freq: Once | INTRAMUSCULAR | Status: AC
Start: 2017-09-04 — End: 2017-09-04
  Administered 2017-09-04: 60 mg via EPIDURAL

## 2017-09-04 MED ORDER — IOPAMIDOL (ISOVUE-M 300) INJECTION 61%
1.0000 mL | Freq: Once | INTRAMUSCULAR | Status: AC | PRN
  Administered 2017-09-04: 1 mL via EPIDURAL

## 2017-09-04 NOTE — Discharge Instructions (Signed)

## 2017-09-29 ENCOUNTER — Other Ambulatory Visit: Payer: Self-pay | Admitting: Physical Medicine and Rehabilitation

## 2017-09-29 DIAGNOSIS — M50821 Other cervical disc disorders at C4-C5 level: Secondary | ICD-10-CM

## 2017-10-26 ENCOUNTER — Inpatient Hospital Stay
Admission: RE | Admit: 2017-10-26 | Discharge: 2017-10-26 | Disposition: A | Discharge status: Home / Self Care | Payer: Medicare Other | Source: Ambulatory Visit | Attending: Physical Medicine and Rehabilitation | Admitting: Physical Medicine and Rehabilitation

## 2017-11-10 ENCOUNTER — Ambulatory Visit
Admission: RE | Admit: 2017-11-10 | Discharge: 2017-11-10 | Disposition: A | Discharge status: Home / Self Care | Payer: Medicare Other | Source: Ambulatory Visit | Attending: Physical Medicine and Rehabilitation | Admitting: Physical Medicine and Rehabilitation

## 2017-11-10 DIAGNOSIS — M50821 Other cervical disc disorders at C4-C5 level: Secondary | ICD-10-CM

## 2017-11-10 MED ORDER — IOPAMIDOL (ISOVUE-M 300) INJECTION 61%
1.0000 mL | Freq: Once | INTRAMUSCULAR | Status: AC | PRN
  Administered 2017-11-10: 1 mL via EPIDURAL

## 2017-11-10 MED ORDER — TRIAMCINOLONE ACETONIDE 40 MG/ML IJ SUSP (RADIOLOGY)
60.0000 mg | Freq: Once | INTRAMUSCULAR | Status: AC
  Administered 2017-11-10: 60 mg via EPIDURAL

## 2017-11-10 NOTE — Discharge Instructions (Signed)

## 2017-11-17 ENCOUNTER — Other Ambulatory Visit: Payer: Medicare Other

## 2017-12-14 ENCOUNTER — Other Ambulatory Visit: Payer: Self-pay | Admitting: Physical Medicine and Rehabilitation

## 2017-12-14 DIAGNOSIS — M5030 Other cervical disc degeneration, unspecified cervical region: Secondary | ICD-10-CM

## 2018-01-07 ENCOUNTER — Ambulatory Visit
Admission: RE | Admit: 2018-01-07 | Discharge: 2018-01-07 | Disposition: A | Discharge status: Home / Self Care | Payer: Medicare Other | Source: Ambulatory Visit | Attending: Physical Medicine and Rehabilitation | Admitting: Physical Medicine and Rehabilitation

## 2018-01-07 DIAGNOSIS — M5030 Other cervical disc degeneration, unspecified cervical region: Secondary | ICD-10-CM

## 2018-01-07 MED ORDER — IOPAMIDOL (ISOVUE-M 300) INJECTION 61%
1.0000 mL | Freq: Once | INTRAMUSCULAR | Status: AC | PRN
  Administered 2018-01-07: 1 mL via EPIDURAL

## 2018-01-07 MED ORDER — TRIAMCINOLONE ACETONIDE 40 MG/ML IJ SUSP (RADIOLOGY)
60.0000 mg | Freq: Once | INTRAMUSCULAR | Status: AC
  Administered 2018-01-07: 60 mg via EPIDURAL

## 2018-01-07 NOTE — Discharge Instructions (Signed)

## 2018-02-11 ENCOUNTER — Other Ambulatory Visit: Payer: Self-pay | Admitting: Physical Medicine and Rehabilitation

## 2018-02-11 DIAGNOSIS — M5030 Other cervical disc degeneration, unspecified cervical region: Secondary | ICD-10-CM

## 2018-02-18 ENCOUNTER — Ambulatory Visit
Admission: RE | Admit: 2018-02-18 | Discharge: 2018-02-18 | Disposition: A | Discharge status: Home / Self Care | Payer: Medicare Other | Source: Ambulatory Visit | Attending: Physical Medicine and Rehabilitation | Admitting: Physical Medicine and Rehabilitation

## 2018-02-18 ENCOUNTER — Other Ambulatory Visit: Payer: Medicare Other

## 2018-02-18 ENCOUNTER — Other Ambulatory Visit: Payer: Self-pay | Admitting: Physical Medicine and Rehabilitation

## 2018-02-18 DIAGNOSIS — M5030 Other cervical disc degeneration, unspecified cervical region: Secondary | ICD-10-CM

## 2018-02-18 MED ORDER — TRIAMCINOLONE ACETONIDE 40 MG/ML IJ SUSP (RADIOLOGY): 60.0000 mg | Freq: Once | INTRAMUSCULAR | Status: DC

## 2018-02-18 MED ORDER — IOPAMIDOL (ISOVUE-M 300) INJECTION 61%: 1.0000 mL | Freq: Once | INTRAMUSCULAR | Status: DC | PRN

## 2018-03-02 ENCOUNTER — Ambulatory Visit
Admission: RE | Admit: 2018-03-02 | Discharge: 2018-03-02 | Disposition: A | Discharge status: Home / Self Care | Payer: Medicare Other | Source: Ambulatory Visit | Attending: Physical Medicine and Rehabilitation | Admitting: Physical Medicine and Rehabilitation

## 2018-03-02 DIAGNOSIS — M5030 Other cervical disc degeneration, unspecified cervical region: Secondary | ICD-10-CM

## 2018-03-02 MED ORDER — IOPAMIDOL (ISOVUE-M 300) INJECTION 61%
1.0000 mL | Freq: Once | INTRAMUSCULAR | Status: AC | PRN
  Administered 2018-03-02: 1 mL via EPIDURAL

## 2018-03-02 MED ORDER — TRIAMCINOLONE ACETONIDE 40 MG/ML IJ SUSP (RADIOLOGY)
60.0000 mg | Freq: Once | INTRAMUSCULAR | Status: AC
  Administered 2018-03-02: 60 mg via EPIDURAL

## 2018-03-11 ENCOUNTER — Emergency Department (HOSPITAL_COMMUNITY): Payer: Medicare Other

## 2018-03-11 ENCOUNTER — Inpatient Hospital Stay (HOSPITAL_COMMUNITY)
Admission: EM | Admit: 2018-03-11 | Discharge: 2018-03-18 | DRG: 871 | Disposition: A | Discharge status: Skilled Nursing Facility | Payer: Medicare Other | Attending: Internal Medicine | Admitting: Internal Medicine

## 2018-03-11 ENCOUNTER — Other Ambulatory Visit: Payer: Self-pay

## 2018-03-11 ENCOUNTER — Encounter (HOSPITAL_COMMUNITY): Payer: Self-pay | Admitting: Medical

## 2018-03-11 DIAGNOSIS — Z79899 Other long term (current) drug therapy: Secondary | ICD-10-CM

## 2018-03-11 DIAGNOSIS — Z681 Body mass index (BMI) 19 or less, adult: Secondary | ICD-10-CM | POA: Diagnosis not present

## 2018-03-11 DIAGNOSIS — A419 Sepsis, unspecified organism: Principal | ICD-10-CM

## 2018-03-11 DIAGNOSIS — D696 Thrombocytopenia, unspecified: Secondary | ICD-10-CM | POA: Diagnosis present

## 2018-03-11 DIAGNOSIS — I4891 Unspecified atrial fibrillation: Secondary | ICD-10-CM | POA: Diagnosis not present

## 2018-03-11 DIAGNOSIS — W19XXXA Unspecified fall, initial encounter: Secondary | ICD-10-CM | POA: Diagnosis not present

## 2018-03-11 DIAGNOSIS — F10239 Alcohol dependence with withdrawal, unspecified: Secondary | ICD-10-CM | POA: Diagnosis present

## 2018-03-11 DIAGNOSIS — J690 Pneumonitis due to inhalation of food and vomit: Secondary | ICD-10-CM | POA: Diagnosis present

## 2018-03-11 DIAGNOSIS — W19XXXD Unspecified fall, subsequent encounter: Secondary | ICD-10-CM | POA: Diagnosis not present

## 2018-03-11 DIAGNOSIS — D72829 Elevated white blood cell count, unspecified: Secondary | ICD-10-CM

## 2018-03-11 DIAGNOSIS — E43 Unspecified severe protein-calorie malnutrition: Secondary | ICD-10-CM | POA: Diagnosis not present

## 2018-03-11 DIAGNOSIS — R6510 Systemic inflammatory response syndrome (SIRS) of non-infectious origin without acute organ dysfunction: Secondary | ICD-10-CM | POA: Diagnosis not present

## 2018-03-11 DIAGNOSIS — R339 Retention of urine, unspecified: Secondary | ICD-10-CM | POA: Diagnosis present

## 2018-03-11 DIAGNOSIS — W1830XA Fall on same level, unspecified, initial encounter: Secondary | ICD-10-CM | POA: Diagnosis present

## 2018-03-11 DIAGNOSIS — J9601 Acute respiratory failure with hypoxia: Secondary | ICD-10-CM | POA: Diagnosis present

## 2018-03-11 DIAGNOSIS — R1310 Dysphagia, unspecified: Secondary | ICD-10-CM | POA: Diagnosis present

## 2018-03-11 DIAGNOSIS — E860 Dehydration: Secondary | ICD-10-CM | POA: Diagnosis present

## 2018-03-11 DIAGNOSIS — I130 Hypertensive heart and chronic kidney disease with heart failure and stage 1 through stage 4 chronic kidney disease, or unspecified chronic kidney disease: Secondary | ICD-10-CM | POA: Diagnosis present

## 2018-03-11 DIAGNOSIS — Y9301 Activity, walking, marching and hiking: Secondary | ICD-10-CM | POA: Diagnosis present

## 2018-03-11 DIAGNOSIS — I480 Paroxysmal atrial fibrillation: Secondary | ICD-10-CM | POA: Diagnosis present

## 2018-03-11 DIAGNOSIS — H409 Unspecified glaucoma: Secondary | ICD-10-CM | POA: Diagnosis present

## 2018-03-11 DIAGNOSIS — Y92009 Unspecified place in unspecified non-institutional (private) residence as the place of occurrence of the external cause: Secondary | ICD-10-CM | POA: Diagnosis not present

## 2018-03-11 DIAGNOSIS — R627 Adult failure to thrive: Secondary | ICD-10-CM | POA: Diagnosis present

## 2018-03-11 DIAGNOSIS — E871 Hypo-osmolality and hyponatremia: Secondary | ICD-10-CM | POA: Diagnosis present

## 2018-03-11 DIAGNOSIS — R748 Abnormal levels of other serum enzymes: Secondary | ICD-10-CM | POA: Diagnosis present

## 2018-03-11 DIAGNOSIS — N183 Chronic kidney disease, stage 3 (moderate): Secondary | ICD-10-CM | POA: Diagnosis present

## 2018-03-11 DIAGNOSIS — R0602 Shortness of breath: Secondary | ICD-10-CM

## 2018-03-11 DIAGNOSIS — J309 Allergic rhinitis, unspecified: Secondary | ICD-10-CM | POA: Diagnosis present

## 2018-03-11 DIAGNOSIS — I361 Nonrheumatic tricuspid (valve) insufficiency: Secondary | ICD-10-CM | POA: Diagnosis not present

## 2018-03-11 DIAGNOSIS — I4892 Unspecified atrial flutter: Secondary | ICD-10-CM | POA: Diagnosis present

## 2018-03-11 DIAGNOSIS — Z8249 Family history of ischemic heart disease and other diseases of the circulatory system: Secondary | ICD-10-CM

## 2018-03-11 DIAGNOSIS — M47812 Spondylosis without myelopathy or radiculopathy, cervical region: Secondary | ICD-10-CM | POA: Diagnosis present

## 2018-03-11 DIAGNOSIS — I340 Nonrheumatic mitral (valve) insufficiency: Secondary | ICD-10-CM | POA: Diagnosis not present

## 2018-03-11 DIAGNOSIS — D539 Nutritional anemia, unspecified: Secondary | ICD-10-CM | POA: Diagnosis present

## 2018-03-11 DIAGNOSIS — N179 Acute kidney failure, unspecified: Secondary | ICD-10-CM | POA: Diagnosis present

## 2018-03-11 DIAGNOSIS — G8929 Other chronic pain: Secondary | ICD-10-CM | POA: Diagnosis present

## 2018-03-11 DIAGNOSIS — Y900 Blood alcohol level of less than 20 mg/100 ml: Secondary | ICD-10-CM | POA: Diagnosis present

## 2018-03-11 DIAGNOSIS — T17800D Unspecified foreign body in other parts of respiratory tract causing asphyxiation, subsequent encounter: Secondary | ICD-10-CM | POA: Diagnosis not present

## 2018-03-11 DIAGNOSIS — E872 Acidosis: Secondary | ICD-10-CM | POA: Diagnosis present

## 2018-03-11 DIAGNOSIS — I5033 Acute on chronic diastolic (congestive) heart failure: Secondary | ICD-10-CM | POA: Diagnosis present

## 2018-03-11 DIAGNOSIS — Z882 Allergy status to sulfonamides status: Secondary | ICD-10-CM

## 2018-03-11 DIAGNOSIS — Z9049 Acquired absence of other specified parts of digestive tract: Secondary | ICD-10-CM

## 2018-03-11 DIAGNOSIS — Z885 Allergy status to narcotic agent status: Secondary | ICD-10-CM

## 2018-03-11 DIAGNOSIS — Z888 Allergy status to other drugs, medicaments and biological substances status: Secondary | ICD-10-CM

## 2018-03-11 DIAGNOSIS — K219 Gastro-esophageal reflux disease without esophagitis: Secondary | ICD-10-CM | POA: Diagnosis present

## 2018-03-11 HISTORY — DX: Heart failure, unspecified: I50.9

## 2018-03-11 LAB — BASIC METABOLIC PANEL
ANION GAP: 16 — AB (ref 5–15)
BUN: 51 mg/dL — ABNORMAL HIGH (ref 6–20)
CHLORIDE: 105 mmol/L (ref 101–111)
CO2: 17 mmol/L — AB (ref 22–32)
Calcium: 8.9 mg/dL (ref 8.9–10.3)
Creatinine, Ser: 1.63 mg/dL — ABNORMAL HIGH (ref 0.44–1.00)
GFR calc Af Amer: 32 mL/min — ABNORMAL LOW (ref >60)
GFR calc non Af Amer: 28 mL/min — ABNORMAL LOW (ref >60)
Glucose, Bld: 104 mg/dL — ABNORMAL HIGH (ref 65–99)
POTASSIUM: 4.3 mmol/L (ref 3.5–5.1)
Sodium: 138 mmol/L (ref 135–145)

## 2018-03-11 LAB — CBC
HEMATOCRIT: 33.2 % — AB (ref 36.0–46.0)
HEMOGLOBIN: 11.4 g/dL — AB (ref 12.0–15.0)
MCH: 36 pg — AB (ref 26.0–34.0)
MCHC: 34.3 g/dL (ref 30.0–36.0)
MCV: 104.7 fL — ABNORMAL HIGH (ref 78.0–100.0)
Platelets: 187 10*3/uL (ref 150–400)
RBC: 3.17 MIL/uL — ABNORMAL LOW (ref 3.87–5.11)
RDW: 13.6 % (ref 11.5–15.5)
WBC: 27.6 10*3/uL — ABNORMAL HIGH (ref 4.0–10.5)

## 2018-03-11 LAB — HEPATIC FUNCTION PANEL
ALBUMIN: 3.2 g/dL — AB (ref 3.5–5.0)
ALK PHOS: 75 U/L (ref 38–126)
ALT: 25 U/L (ref 14–54)
AST: 54 U/L — ABNORMAL HIGH (ref 15–41)
Bilirubin, Direct: 0.2 mg/dL (ref 0.1–0.5)
Indirect Bilirubin: 1 mg/dL — ABNORMAL HIGH (ref 0.3–0.9)
Total Bilirubin: 1.2 mg/dL (ref 0.3–1.2)
Total Protein: 6.6 g/dL (ref 6.5–8.1)

## 2018-03-11 LAB — LIPASE, BLOOD: Lipase: 160 U/L — ABNORMAL HIGH (ref 11–51)

## 2018-03-11 LAB — I-STAT CG4 LACTIC ACID, ED
LACTIC ACID, VENOUS: 2.58 mmol/L — AB (ref 0.5–1.9)
Lactic Acid, Venous: 3.04 mmol/L (ref 0.5–1.9)

## 2018-03-11 LAB — DIFFERENTIAL
BASOS PCT: 0 %
Basophils Absolute: 0 10*3/uL (ref 0.0–0.1)
EOS PCT: 0 %
Eosinophils Absolute: 0 10*3/uL (ref 0.0–0.7)
LYMPHS PCT: 6 %
Lymphs Abs: 1.7 10*3/uL (ref 0.7–4.0)
MONOS PCT: 3 %
Monocytes Absolute: 0.8 10*3/uL (ref 0.1–1.0)
NEUTROS ABS: 25.1 10*3/uL — AB (ref 1.7–7.7)
NEUTROS PCT: 91 %

## 2018-03-11 LAB — ETHANOL: Alcohol, Ethyl (B): 14 mg/dL — ABNORMAL HIGH (ref <10)

## 2018-03-11 LAB — CK: Total CK: 196 U/L (ref 38–234)

## 2018-03-11 MED ORDER — SODIUM CHLORIDE 0.9 % IV BOLUS
1000.0000 mL | Freq: Once | INTRAVENOUS | Status: AC
  Administered 2018-03-11: 1000 mL via INTRAVENOUS

## 2018-03-11 MED ORDER — SODIUM CHLORIDE 0.9 % IV BOLUS: 500.0000 mL | Freq: Once | INTRAVENOUS | Status: DC

## 2018-03-11 MED ORDER — SODIUM CHLORIDE 0.9 % IV SOLN
1.0000 mg | Freq: Once | INTRAVENOUS | Status: AC
  Administered 2018-03-11: 1 mg via INTRAVENOUS
  Filled 2018-03-11: qty 0.2

## 2018-03-11 MED ORDER — LORAZEPAM 2 MG/ML IJ SOLN
0.0000 mg | Freq: Four times a day (QID) | INTRAMUSCULAR | Status: AC
  Administered 2018-03-12: 2 mg via INTRAVENOUS
  Filled 2018-03-11: qty 1

## 2018-03-11 MED ORDER — THIAMINE HCL 100 MG/ML IJ SOLN
100.0000 mg | Freq: Once | INTRAMUSCULAR | Status: AC
  Administered 2018-03-11: 100 mg via INTRAVENOUS
  Filled 2018-03-11: qty 2

## 2018-03-11 MED ORDER — LORAZEPAM 1 MG PO TABS
0.0000 mg | ORAL_TABLET | Freq: Four times a day (QID) | ORAL | Status: AC
  Filled 2018-03-11: qty 2

## 2018-03-11 MED ORDER — LORAZEPAM 1 MG PO TABS
0.0000 mg | ORAL_TABLET | Freq: Two times a day (BID) | ORAL | Status: AC
  Administered 2018-03-15: 1 mg via ORAL
  Filled 2018-03-11: qty 1

## 2018-03-11 MED ORDER — SODIUM CHLORIDE 0.9 % IV BOLUS
500.0000 mL | Freq: Once | INTRAVENOUS | Status: AC
  Administered 2018-03-11: 500 mL via INTRAVENOUS

## 2018-03-11 MED ORDER — PIPERACILLIN-TAZOBACTAM 3.375 G IVPB 30 MIN
3.3750 g | Freq: Once | INTRAVENOUS | Status: AC
  Administered 2018-03-11: 3.375 g via INTRAVENOUS
  Filled 2018-03-11 (×2): qty 50

## 2018-03-11 MED ORDER — LORAZEPAM 2 MG/ML IJ SOLN
0.0000 mg | Freq: Two times a day (BID) | INTRAMUSCULAR | Status: AC
Start: 2018-03-14 — End: 2018-03-16

## 2018-03-11 MED ORDER — VANCOMYCIN HCL IN DEXTROSE 1-5 GM/200ML-% IV SOLN
1000.0000 mg | Freq: Once | INTRAVENOUS | Status: AC
  Administered 2018-03-11: 1000 mg via INTRAVENOUS
  Filled 2018-03-11 (×2): qty 200

## 2018-03-11 NOTE — ED Notes (Signed)
Bed: WA10 Expected date:  Expected time:  Means of arrival:  Comments: EMS elderly fall 

## 2018-03-11 NOTE — ED Notes (Signed)
Pt still having scans done. Unable to obtain vitals

## 2018-03-11 NOTE — ED Notes (Signed)
Vitals update delayed, pt is currently having scans done.

## 2018-03-11 NOTE — ED Notes (Signed)
Unable to obtain second set of blood cultures.  

## 2018-03-11 NOTE — ED Notes (Signed)
Rn attempted to obtain second set of blood cultures. RN stuck pt x3 for cultures with no success.

## 2018-03-11 NOTE — Progress Notes (Signed)
When doing nursing admission history, pt states she will drive herself to the dr for dr's appointment. Daughter states pt has not driven since November. Pt's daughter followed me out into hall and told me where her mother could not hear - that pt needs help at home. Daughter states that they tried to get help before and her mom rejected it. Consult was made in computer for case manager to provide assistance with home health needs and transportation issues. Briscoe Burns BSN, RN-BC Admissions RN 03/11/2018 9:03 PM

## 2018-03-11 NOTE — Progress Notes (Signed)
A consult was received from an ED physician for vanc/Zosyn per pharmacy dosing.  The patient's profile has been reviewed for ht/wt/allergies/indication/available labs.   A one time order has been placed for vanc 1g and Zosyn 3.375g.  Further antibiotics/pharmacy consults should be ordered by admitting physician if indicated.                       Thank you, Berkley Harvey 03/11/2018  7:51 PM

## 2018-03-11 NOTE — ED Notes (Addendum)
Notified EDP,Isaacs, pt. I-stat CG4 Lactic acid results 2.58 and RN, Reita Cliche made aware.

## 2018-03-11 NOTE — H&P (Signed)
History and Physical    MURPHY BUNDICK WRU:[id number] DOB: 23-Jul-1931 DOA: 03/11/2018  PCP: Kaleen Mask, MD   Patient coming from: home   Chief Complaint: fall  HPI: Wendy Patterson is a 84 y.o. female with medical history significant of multiple allergies, chronic diastolic heart failure, paroxysmal atrial fibrillation who comes into the emergency department after a fall and found to have AKI, elevated lactate and elevated white blood cell count concerning for sepsis.  Patient is a very poor historian.  She reports that she fell approximately yesterday with a mechanical fall while walking between rooms.  At home she had to use a cane or walker intermittently.  She lives alone except for a hairdresser who comes to check in on her.  She denies any chest pain, palpitations, syncope/presyncope during this time.  She reports falling and having severe pain everywhere it was particularly worse in her neck where she has chronic pain from cervical osteoarthritis.  She denies any loss of continence of stool or bladder.  She denies any fevers, cough, abdominal pain, nausea, vomiting, diarrhea, rash.  Of note per discussion with the daughter and the patient she apparently has several drinks a day later part of the afternoon.  She apparently has food made by her daughter song when they visit over she primarily relies on microwavable meals which she does not enjoy much and hence does not eat much.  She is described diminished appetite over the last several weeks.   ED Course: In the ED patient's vitals were notable for mild tachypnea which resolved.  Labs are notable for a white count of 27.6, mild anemia, creatinine 1.63 with an elevated BUN to 51.  Lactate was over 3.  Extensive imaging including head CT, neck CT, abdominal pelvis CT without contrast, chest x-ray showed no evidence of infection.  Patient received empiric antibiotics fluids.  Of note patient's ethanol level was noted to be 14.  While being  evaluated patient had a brief a symptomatic run of A. fib with RVR with heart rate in the 170s.  Review of Systems: As per HPI otherwise 10 point review of systems negative.    Past Medical History:  Diagnosis Date  . Atrial fibrillation (HCC)    Intermittent/atrial flutter -- pt reports this is not a hard finding  . CHF (congestive heart failure) (HCC)   . Glaucoma   . HTN (hypertension)   . Leg edema     Past Surgical History:  Procedure Laterality Date  . CHOLECYSTECTOMY    . GLAUCOMA SURGERY    . US ECHOCARDIOGRAPHY  06-12-2008   EF 55-60%     reports that she is a non-smoker but has been exposed to tobacco smoke. She has never used smokeless tobacco. She reports that she drinks alcohol. She reports that she does not use drugs.  Allergies  Allergen Reactions  . Amlodipine Swelling    Caused significant leg edema   . Oxycodone Hcl Nausea Only and Other (See Comments)    Dizziness and nausea   . Sulfa Drugs Cross Reactors Other (See Comments)    Cause Asthma    Family History  Problem Relation Age of Onset  . Heart disease Father   . Colon cancer Mother   . Breast cancer Sister 49  . Heart disease Brother   . Breast cancer Sister 40  . Aortic aneurysm Brother   . Heart disease Sister    Unacceptable: Noncontributory, unremarkable, or negative. Acceptable: Family history reviewed and  not pertinent (If you reviewed it)  Prior to Admission medications   Medication Sig Start Date End Date Taking? Authorizing Provider  Azelastine-Fluticasone (DYMISTA) 137-50 MCG/ACT SUSP Place 1 puff into the nose 2 (two) times daily. 09/19/16  Yes Nyoka Cowden, MD  bumetanide (BUMEX) 2 MG tablet Take 2 mg by mouth daily.     Yes [provider]  Cyanocobalamin (VITAMIN B12 PO) Take 1 tablet by mouth daily.    Yes [provider]  estrogens, conjugated, (PREMARIN) 1.25 MG tablet Take 1.25 mg by mouth. Every Other Day   Yes [provider]  ibuprofen  (ADVIL,MOTRIN) 200 MG tablet Take 400 mg by mouth every 6 (six) hours as needed (neck pain).   Yes [provider]  Pyridoxine HCl (VITAMIN B6 PO) Take 1 tablet by mouth daily.    Yes [provider]  timolol (TIMOPTIC) 0.25 % ophthalmic solution Place 1 drop into the left eye 2 (two) times daily.   Yes [provider]  Vitamin D, Ergocalciferol, (DRISDOL) 50000 UNITS CAPS Take 50,000 Units by mouth. Every Other Week   Yes [provider]    Physical Exam: Vitals:   03/11/18 1930 03/11/18 2000 03/11/18 2100 03/11/18 2204  BP: 118/83 137/86 102/78 (!) 146/77  Pulse: 98 92 100 94  Resp: (!) 43 20 20 19   Temp:    98.7 F (37.1 C)  TempSrc:    Oral  SpO2: (!) 89% 100% 100% 92%  Weight:      Height:        Constitutional: NAD, calm, comfortable Vitals:   03/11/18 1930 03/11/18 2000 03/11/18 2100 03/11/18 2204  BP: 118/83 137/86 102/78 (!) 146/77  Pulse: 98 92 100 94  Resp: (!) 43 20 20 19   Temp:    98.7 F (37.1 C)  TempSrc:    Oral  SpO2: (!) 89% 100% 100% 92%  Weight:      Height:       Eyes: Anicteric sclera ENMT: Dry mucous membranes, good dentition Neck: Stiff, no midline tenderness however tender over sides of neck bilaterally Respiratory: clear to auscultation bilaterally, no wheezing, no crackles. Normal respiratory effort. No accessory muscle use.  Cardiovascular: Irregularly irregular rhythm, no murmurs Abdomen: Soft, nondistended, plus bowel sounds, no rebound or guarding Musculoskeletal: Diffusely painful all over however no point tenderness, noted to have severe osteoarthritis hands Skin: Contusions noted Neurologic: CN 2-12 intact, moving all extremities Psychiatric: Poor judgment and insight. Alert and oriented x 3. Normal mood.     Labs on Admission: I have personally reviewed following labs and imaging studies  CBC: Recent Labs  Lab 03/11/18 1522  WBC 27.6*  NEUTROABS 25.1*  HGB 11.4*  HCT 33.2*  MCV 104.7*  PLT  187   Basic Metabolic Panel: Recent Labs  Lab 03/11/18 1522  NA 138  K 4.3  CL 105  CO2 17*  GLUCOSE 104*  BUN 51*  CREATININE 1.63*  CALCIUM 8.9   GFR: Estimated Creatinine Clearance: 18.4 mL/min (A) (by C-G formula based on SCr of 1.63 mg/dL (H)). Liver Function Tests: Recent Labs  Lab 03/11/18 1522  AST 54*  ALT 25  ALKPHOS 75  BILITOT 1.2  PROT 6.6  ALBUMIN 3.2*   Recent Labs  Lab 03/11/18 1522  LIPASE 160*   No results for input(s): AMMONIA in the last 168 hours. Coagulation Profile: No results for input(s): INR, PROTIME in the last 168 hours. Cardiac Enzymes: Recent Labs  Lab 03/11/18 1522  CKTOTAL  196   BNP (last 3 results) No results for input(s): PROBNP in the last 8760 hours. HbA1C: No results for input(s): HGBA1C in the last 72 hours. CBG: No results for input(s): GLUCAP in the last 168 hours. Lipid Profile: No results for input(s): CHOL, HDL, LDLCALC, TRIG, CHOLHDL, LDLDIRECT in the last 72 hours. Thyroid Function Tests: No results for input(s): TSH, T4TOTAL, FREET4, T3FREE, THYROIDAB in the last 72 hours. Anemia Panel: No results for input(s): VITAMINB12, FOLATE, FERRITIN, TIBC, IRON, RETICCTPCT in the last 72 hours. Urine analysis: No results found for: COLORURINE, APPEARANCEUR, LABSPEC, PHURINE, GLUCOSEU, HGBUR, BILIRUBINUR, KETONESUR, PROTEINUR, UROBILINOGEN, NITRITE, LEUKOCYTESUR  Radiological Exams on Admission: Ct Abdomen Pelvis Wo Contrast  Result Date: 03/11/2018 CLINICAL DATA:  Fall with nausea and vomiting EXAM: CT ABDOMEN AND PELVIS WITHOUT CONTRAST TECHNIQUE: Multidetector CT imaging of the abdomen and pelvis was performed following the standard protocol without IV contrast. COMPARISON:  03/16/2017 FINDINGS: LOWER CHEST: Small foci of subsegmental atelectasis in the right lower lobe. HEPATOBILIARY: Normal hepatic contours and density. No intra- or extrahepatic biliary dilatation. Status post cholecystectomy. PANCREAS: Normal  parenchymal contours without ductal dilatation. No peripancreatic fluid collection. SPLEEN: Normal. ADRENALS/URINARY TRACT: --Adrenal glands: Normal. --Right kidney/ureter: No hydronephrosis, nephroureterolithiasis, perinephric stranding or solid renal mass. --Left kidney/ureter: No hydronephrosis, nephroureterolithiasis, perinephric stranding or solid renal mass. --Urinary bladder: Normal for degree of distention STOMACH/BOWEL: --Stomach/Duodenum: No hiatal hernia or other gastric abnormality. Normal duodenal course. --Small bowel: No dilatation or inflammation. --Colon: No focal abnormality. --Appendix: Not visualized. No right lower quadrant inflammation or free fluid. VASCULAR/LYMPHATIC: Atherosclerotic calcification is present within the non-aneurysmal abdominal aorta, without hemodynamically significant stenosis. No abdominal or pelvic lymphadenopathy. REPRODUCTIVE: Status post hysterectomy. No adnexal mass. MUSCULOSKELETAL. No bony spinal canal stenosis or focal osseous abnormality. OTHER: None. IMPRESSION: 1. No acute abdominal or pelvic abnormality. 2.  Aortic Atherosclerosis (ICD10-I70.0). Electronically Signed   By: Deatra Robinson M.D.   On: 03/11/2018 18:25   Dg Lumbar Spine Complete  Result Date: 03/11/2018 CLINICAL DATA:  Hip pain EXAM: LUMBAR SPINE - COMPLETE 4+ VIEW COMPARISON:  CT abdomen dated 03/11/2018. FINDINGS: Stable levoscoliosis. No evidence of acute vertebral body subluxation. No acute or suspicious osseous abnormality. Degenerative changes again noted within the mid and lower cervical spine, mild to moderate in degree. Visualized paravertebral soft tissues are unremarkable. IMPRESSION: 1. No acute findings. 2. Stable scoliosis. 3. Degenerative changes within the mid and lower lumbar spine, mild to moderate in degree, likely related to the underlying chronic scoliosis. Comparing to the previous CT of 03/11/2018, there is at least some degree of osseous neural foramen narrowing on the LEFT  at the L5-S1 level which could potentially cause nerve root impingement and LEFT lower extremity radiculopathy. If clinically indicated, consider MRI to exclude associated nerve root impingement. Electronically Signed   By: Bary Richard M.D.   On: 03/11/2018 18:52   Ct Head Wo Contrast  Result Date: 03/11/2018 CLINICAL DATA:  Patient fell sometime yesterday and has been found on the floor since the fall. EXAM: CT HEAD WITHOUT CONTRAST CT CERVICAL SPINE WITHOUT CONTRAST TECHNIQUE: Multidetector CT imaging of the head and cervical spine was performed following the standard protocol without intravenous contrast. Multiplanar CT image reconstructions of the cervical spine were also generated. COMPARISON:  Radiographs of the cervical spine from 04/08/2017 FINDINGS: CT HEAD FINDINGS Brain: Age related involutional changes of the brain with mild-to-moderate small vessel ischemic disease of periventricular and subcortical white matter. No large vascular territory infarct, hemorrhage, midline shift or  edema. No intra-axial mass nor extra-axial fluid collections. Vascular: No hyperdense vessel sign.  No unexpected calcifications. Skull: No acute skull fracture or significant scalp swelling. Sinuses/Orbits: Bilateral lens replacements. Mild ethmoid sinus mucosal thickening. Clear mastoids bilaterally. Other: None CT CERVICAL SPINE FINDINGS Alignment: Chronic facet mediated spondylolisthesis at multiple levels of the upper cervical spine with grade 1 spondylolisthesis of C3 on C4, C4 on C5 and C5 on C6, the greatest at C5-6 measuring 2 mm. Skull base and vertebrae: No acute fracture. No primary bone lesion or focal pathologic process. Soft tissues and spinal canal: No prevertebral fluid or swelling. No visible canal hematoma. Disc levels: Cervical spondylosis with multilevel disc flattening is identified mild from C2 through C5 and marked from C5 through C7, most severely at C6-7. No jumped or perched facets. Multilevel  degenerative facet arthropathy with joint space narrowing sclerosis is identified of the cervical spine. Uncinate spurring with uncovertebral joint osteoarthritis is noted on the right at C3-4, C4-5 and bilaterally at C5-6 and C6-7. Mild right-sided foraminal encroachment from C4 through C7. Upper chest: Sub segmental atelectasis and/or scarring at the apices. Other: Atherosclerosis of the carotid bifurcations bilaterally. IMPRESSION: 1. Age related involutional changes of the brain without acute intracranial abnormality. Chronic appearing small vessel ischemic disease. 2. Redemonstration of cervical spondylosis with multilevel grade 1 spondylolisthesis from C3 through C6, greatest at C5-6 measuring 2 mm. These are believed to mediated by degenerative facet arthropathy. 3. No acute cervical spine fracture. Electronically Signed   By: Tollie Eth M.D.   On: 03/11/2018 18:16   Ct Cervical Spine Wo Contrast  Result Date: 03/11/2018 CLINICAL DATA:  Patient fell sometime yesterday and has been found on the floor since the fall. EXAM: CT HEAD WITHOUT CONTRAST CT CERVICAL SPINE WITHOUT CONTRAST TECHNIQUE: Multidetector CT imaging of the head and cervical spine was performed following the standard protocol without intravenous contrast. Multiplanar CT image reconstructions of the cervical spine were also generated. COMPARISON:  Radiographs of the cervical spine from 04/08/2017 FINDINGS: CT HEAD FINDINGS Brain: Age related involutional changes of the brain with mild-to-moderate small vessel ischemic disease of periventricular and subcortical white matter. No large vascular territory infarct, hemorrhage, midline shift or edema. No intra-axial mass nor extra-axial fluid collections. Vascular: No hyperdense vessel sign.  No unexpected calcifications. Skull: No acute skull fracture or significant scalp swelling. Sinuses/Orbits: Bilateral lens replacements. Mild ethmoid sinus mucosal thickening. Clear mastoids bilaterally.  Other: None CT CERVICAL SPINE FINDINGS Alignment: Chronic facet mediated spondylolisthesis at multiple levels of the upper cervical spine with grade 1 spondylolisthesis of C3 on C4, C4 on C5 and C5 on C6, the greatest at C5-6 measuring 2 mm. Skull base and vertebrae: No acute fracture. No primary bone lesion or focal pathologic process. Soft tissues and spinal canal: No prevertebral fluid or swelling. No visible canal hematoma. Disc levels: Cervical spondylosis with multilevel disc flattening is identified mild from C2 through C5 and marked from C5 through C7, most severely at C6-7. No jumped or perched facets. Multilevel degenerative facet arthropathy with joint space narrowing sclerosis is identified of the cervical spine. Uncinate spurring with uncovertebral joint osteoarthritis is noted on the right at C3-4, C4-5 and bilaterally at C5-6 and C6-7. Mild right-sided foraminal encroachment from C4 through C7. Upper chest: Sub segmental atelectasis and/or scarring at the apices. Other: Atherosclerosis of the carotid bifurcations bilaterally. IMPRESSION: 1. Age related involutional changes of the brain without acute intracranial abnormality. Chronic appearing small vessel ischemic disease. 2. Redemonstration of cervical spondylosis  with multilevel grade 1 spondylolisthesis from C3 through C6, greatest at C5-6 measuring 2 mm. These are believed to mediated by degenerative facet arthropathy. 3. No acute cervical spine fracture. Electronically Signed   By: Tollie Eth M.D.   On: 03/11/2018 18:16   Dg Chest Port 1 View  Result Date: 03/11/2018 CLINICAL DATA:  Fall EXAM: PORTABLE CHEST 1 VIEW COMPARISON:  Chest x-ray dated 06/20/2016. FINDINGS: Heart size and mediastinal contours appear stable. Lungs appear grossly clear, difficult to definitively characterize due to mild patient motion artifact. No pleural effusion or pneumothorax seen. No osseous fracture or dislocation seen. IMPRESSION: No acute findings.  Electronically Signed   By: Bary Richard M.D.   On: 03/11/2018 18:55   Dg Hand Complete Left  Result Date: 03/11/2018 CLINICAL DATA:  Pain EXAM: LEFT HAND - COMPLETE 3+ VIEW COMPARISON:  None. FINDINGS: Advanced changes of chronic degenerative erosive osteoarthritis at the second through fourth DIP joints. Associated mild subluxations of the first, third and fourth distal phalanx. No acute appearing cortical irregularity or osseous lesion. No fracture line or displaced fracture fragment. IMPRESSION: 1. No acute findings. 2. 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 Electronically Signed   By: Bary Richard M.D.   On: 03/11/2018 18:54   Dg Hip Unilat W Or Wo Pelvis 2-3 Views Right  Result Date: 03/11/2018 CLINICAL DATA:  Larey Seat sometime yesterday.  Leg pain. EXAM: DG HIP (WITH OR WITHOUT PELVIS) 2-3V RIGHT COMPARISON:  No comparison studies available. FINDINGS: Frontal pelvis shows demineralization without an acute fracture. SI joints and symphysis pubis unremarkable. AP and frog-leg lateral views of the right hip show no femoral neck fracture. IMPRESSION: Negative. Electronically Signed   By: Kennith Center M.D.   On: 03/11/2018 18:48    EKG: Independently reviewed. Extremely poor quality, appears to be atrial flutter with 3-1 variable Block  Assessment/Plan Principal Problem:   AKI (acute kidney injury) (HCC) Active Problems:   Allergic rhinitis   Afib (HCC)   GERD (gastroesophageal reflux disease)   Fall at home, initial encounter   Sepsis [facility])   Atrial fibrillation with rapid ventricular response (HCC)   #) Fall: Unclear if this fall is mechanical in nature not particularly with her brief run of atrial fibrillation with RVR.  Certainly a contributing factor is her surreptitious alcohol use for which she denies significant use.  Fortunately at this time she does not have  any clear signs or symptoms sequelae from the fall other than contusions. -Physical therapy consult - Case management consult for placement  #) AKI: In the setting of diminished p.o. intake and significant amounts of alcohol intake.  She has been taking bumetanide for her chronic diastolic heart failure and she reports taking this up to 5 times a day before too much. -Hold diuretics -Gentle IV fluids at 75 mL's an hour x1 day  #) Alcohol use/abuse, unclear precisely how much she is drinking however suspect is more than she is letting on.  She does have an elevated MCV suggesting at least some degree of alcoholism  -CIWA  #) Elevated MCV: Patient is on B12 supplementation however suspect this is also related to alcohol ingestion. -Continue B12 supplementation  #) Sepsis: At this time there is no clear source for infection.  She has not had any fevers or any other localizing signs or symptoms.  She is received empiric antibiotics including the emergency department. -Follow-up  blood cultures from 03/11/2018  -we will hold on antibiotics at this time  #) Failure to thrive: Patient appears to have moderate protein calorie malnutrition -Nutrition consult  #) Paroxysmal atrial fibrillation with RVR: Patient apparently has a known history of paroxysmal atrial fibrillation however she opted not to pursue anticoagulation and was just on aspirin which was subsequently removed due to GI upset. -Echo -Start metoprolol tartrate 12.5 mg twice daily -We will readdress anticoagulation with the family possibly with aspirin  #) Chronic diastolic heart failure: Noted on echo on 07/15/2016. -Hold diuretics  #) Chronic neck pain: Due to osteoarthritis of the cervical spine. - PRN acetaminophen -PRN tramadol -Discontinue NSAIDs  Fluids: Per above Elect lites: Monitor and supplement Nutrition: Regular diet  Disposition: Pending PT evaluation and resolution of lab abnormalities and evaluation of  sepsis  Prophylaxis: Enoxaparin  Full code  Delaine Lame MD [facility]   If 7PM-7AM, please contact night-coverage [email or web address] Password [email or web address]  03/11/2018, 10:55 PM

## 2018-03-11 NOTE — ED Provider Notes (Signed)
[facility]-EMERGENCY DEPT Provider Note   CSN: [id number] Arrival date & time: 03/11/18  1428     History   Chief Complaint Chief Complaint  Patient presents with  . Fall    HPI Wendy Patterson is a 84 y.o. female who presents with a fall. PMH significant for hx of CHF, A.fib, glaucoma, HTN, arthritis, chronic neck pain. She states that she had a fall last night. She "lost her balance" and was on the floor all night. She states she drank a "night cap" of Christiane Ha which she does every night. She complains of neck pain, low back pain, left hand pain and diffuse leg pain. She also complains of being very thirsty and constantly asks for water. She is normally ambulatory with a walker but does not use it consistently. She has diffuse pain. She denies fever, chest pain, SOB, vomiting, diarrhea, or urinary symptoms. She is asking to go home.  HPI  Past Medical History:  Diagnosis Date  . Atrial fibrillation (HCC)    Intermittent/atrial flutter -- pt reports this is not a hard finding  . Glaucoma   . HTN (hypertension)   . Leg edema     Patient Active Problem List   Diagnosis Date Noted  . Allergic rhinitis 08/22/2016  . SOB (shortness of breath) 08/21/2016  . Afib (HCC) 04/27/2015  . GERD (gastroesophageal reflux disease) 04/27/2015  . HTN (hypertension) 11/18/2011     The histories are not reviewed yet. Please review them in the "History" navigator section and refresh this SmartLink.   OB History   None      Home Medications    Prior to Admission medications   Medication Sig Start Date End Date Taking? Authorizing Provider  Azelastine-Fluticasone (DYMISTA) 137-50 MCG/ACT SUSP Place 1 puff into the nose 2 (two) times daily. 09/19/16  Yes Nyoka Cowden, MD  bumetanide (BUMEX) 2 MG tablet Take 2 mg by mouth daily.     Yes [provider]  Cyanocobalamin (VITAMIN B12 PO) Take 1 tablet by mouth daily.    Yes [provider]    estrogens, conjugated, (PREMARIN) 1.25 MG tablet Take 1.25 mg by mouth. Every Other Day   Yes [provider]  ibuprofen (ADVIL,MOTRIN) 200 MG tablet Take 400 mg by mouth every 6 (six) hours as needed (neck pain).   Yes [provider]  Pyridoxine HCl (VITAMIN B6 PO) Take 1 tablet by mouth daily.    Yes [provider]  timolol (TIMOPTIC) 0.25 % ophthalmic solution Place 1 drop into the left eye 2 (two) times daily.   Yes [provider]  Vitamin D, Ergocalciferol, (DRISDOL) 50000 UNITS CAPS Take 50,000 Units by mouth. Every Other Week   Yes [provider]    Family History Family History  Problem Relation Age of Onset  . Heart disease Father   . Colon cancer Mother   . Breast cancer Sister 66  . Heart disease Brother   . Breast cancer Sister 19  . Aortic aneurysm Brother   . Heart disease Sister     Social History Social History   Tobacco Use  . Smoking status: Passive Smoke Exposure - Never Smoker  . Smokeless tobacco: Never Used  Substance Use Topics  . Alcohol use: Yes    Comment: 7 glasses wine / liquor  . Drug use: No     Allergies   Amlodipine; Oxycodone hcl; and Sulfa drugs cross reactors   Review of Systems Review of  Systems  Constitutional: Negative for fever.  Respiratory: Negative for shortness of breath.   Cardiovascular: Negative for chest pain.  Gastrointestinal: Negative for abdominal pain.  Genitourinary: Negative for dysuria.  Musculoskeletal: Positive for arthralgias, back pain and neck pain.  Neurological: Negative for syncope and headaches.  All other systems reviewed and are negative.    Physical Exam Updated Vital Signs BP (!) 157/96 (BP Location: Left Arm)   Pulse 99   Temp 98.9 F (37.2 C) (Oral)   Resp 17   Ht 5\' 2"  (1.575 m)   Wt 45.4 kg (100 lb)   SpO2 99%   BMI 18.29 kg/m   Physical Exam  Constitutional: She is oriented to person, place, and time. She appears distressed (mild).   Chronically ill appearing, frail  HENT:  Head: Normocephalic and atraumatic.  Dry mucous membranes  Eyes: Pupils are equal, round, and reactive to light. Conjunctivae are normal. Right eye exhibits no discharge. Left eye exhibits no discharge. No scleral icterus.  Neck: Normal range of motion.  Mid-line tenderness  Cardiovascular: Tachycardia present. Exam reveals no gallop and no friction rub.  No murmur heard. Pulmonary/Chest: Effort normal and breath sounds normal. No respiratory distress.  Abdominal: Soft. Bowel sounds are normal. She exhibits no distension. There is tenderness (mild, diffuse).  Musculoskeletal:  Diffuse lumbar tenderness  Neurological: She is alert and oriented to person, place, and time.  Skin: Skin is warm and dry.  Psychiatric: She has a normal mood and affect. Her behavior is normal.  Nursing note and vitals reviewed.    ED Treatments / Results  Labs (all labs ordered are listed, but only abnormal results are displayed) Labs Reviewed  BASIC METABOLIC PANEL - Abnormal; Notable for the following components:      Result Value   CO2 17 (*)    Glucose, Bld 104 (*)    BUN 51 (*)    Creatinine, Ser 1.63 (*)    GFR calc non Af Amer 28 (*)    GFR calc Af Amer 32 (*)    Anion gap 16 (*)    All other components within normal limits  CBC - Abnormal; Notable for the following components:   WBC 27.6 (*)    RBC 3.17 (*)    Hemoglobin 11.4 (*)    HCT 33.2 (*)    MCV 104.7 (*)    MCH 36.0 (*)    All other components within normal limits  ETHANOL - Abnormal; Notable for the following components:   Alcohol, Ethyl (B) 14 (*)    All other components within normal limits  HEPATIC FUNCTION PANEL - Abnormal; Notable for the following components:   Albumin 3.2 (*)    AST 54 (*)    Indirect Bilirubin 1.0 (*)    All other components within normal limits  LIPASE, BLOOD - Abnormal; Notable for the following components:   Lipase 160 (*)    All other components within  normal limits  DIFFERENTIAL - Abnormal; Notable for the following components:   Neutro Abs 25.1 (*)    All other components within normal limits  I-STAT CG4 LACTIC ACID, ED - Abnormal; Notable for the following components:   Lactic Acid, Venous 3.04 (*)    All other components within normal limits  CULTURE, BLOOD (ROUTINE X 2)  CULTURE, BLOOD (ROUTINE X 2)  CK  URINALYSIS, ROUTINE W REFLEX MICROSCOPIC  PROCALCITONIN  PROCALCITONIN  I-STAT CG4 LACTIC ACID, ED    EKG EKG Interpretation  Date/Time:  Thursday  Mar 11 2018 17:16:14 EDT Ventricular Rate:  91 PR Interval:    QRS Duration: 124 QT Interval:  380 QTC Calculation: 458 R Axis:   38 Text Interpretation:  Atrial fibrillation Nonspecific intraventricular conduction delay Artifact in lead(s) I II III aVR aVL aVF V1 V6 Interpretation limited secondary to artifact Confirmed by Shaune Pollack [phone]) on 03/11/2018 5:24:18 PM   Radiology Ct Abdomen Pelvis Wo Contrast  Result Date: 03/11/2018 CLINICAL DATA:  Fall with nausea and vomiting EXAM: CT ABDOMEN AND PELVIS WITHOUT CONTRAST TECHNIQUE: Multidetector CT imaging of the abdomen and pelvis was performed following the standard protocol without IV contrast. COMPARISON:  03/16/2017 FINDINGS: LOWER CHEST: Small foci of subsegmental atelectasis in the right lower lobe. HEPATOBILIARY: Normal hepatic contours and density. No intra- or extrahepatic biliary dilatation. Status post cholecystectomy. PANCREAS: Normal parenchymal contours without ductal dilatation. No peripancreatic fluid collection. SPLEEN: Normal. ADRENALS/URINARY TRACT: --Adrenal glands: Normal. --Right kidney/ureter: No hydronephrosis, nephroureterolithiasis, perinephric stranding or solid renal mass. --Left kidney/ureter: No hydronephrosis, nephroureterolithiasis, perinephric stranding or solid renal mass. --Urinary bladder: Normal for degree of distention STOMACH/BOWEL: --Stomach/Duodenum: No hiatal hernia or other gastric  abnormality. Normal duodenal course. --Small bowel: No dilatation or inflammation. --Colon: No focal abnormality. --Appendix: Not visualized. No right lower quadrant inflammation or free fluid. VASCULAR/LYMPHATIC: Atherosclerotic calcification is present within the non-aneurysmal abdominal aorta, without hemodynamically significant stenosis. No abdominal or pelvic lymphadenopathy. REPRODUCTIVE: Status post hysterectomy. No adnexal mass. MUSCULOSKELETAL. No bony spinal canal stenosis or focal osseous abnormality. OTHER: None. IMPRESSION: 1. No acute abdominal or pelvic abnormality. 2.  Aortic Atherosclerosis (ICD10-I70.0). Electronically Signed   By: Deatra Robinson M.D.   On: 03/11/2018 18:25   Dg Lumbar Spine Complete  Result Date: 03/11/2018 CLINICAL DATA:  Hip pain EXAM: LUMBAR SPINE - COMPLETE 4+ VIEW COMPARISON:  CT abdomen dated 03/11/2018. FINDINGS: Stable levoscoliosis. No evidence of acute vertebral body subluxation. No acute or suspicious osseous abnormality. Degenerative changes again noted within the mid and lower cervical spine, mild to moderate in degree. Visualized paravertebral soft tissues are unremarkable. IMPRESSION: 1. No acute findings. 2. Stable scoliosis. 3. Degenerative changes within the mid and lower lumbar spine, mild to moderate in degree, likely related to the underlying chronic scoliosis. Comparing to the previous CT of 03/11/2018, there is at least some degree of osseous neural foramen narrowing on the LEFT at the L5-S1 level which could potentially cause nerve root impingement and LEFT lower extremity radiculopathy. If clinically indicated, consider MRI to exclude associated nerve root impingement. Electronically Signed   By: Bary Richard M.D.   On: 03/11/2018 18:52   Ct Head Wo Contrast  Result Date: 03/11/2018 CLINICAL DATA:  Patient fell sometime yesterday and has been found on the floor since the fall. EXAM: CT HEAD WITHOUT CONTRAST CT CERVICAL SPINE WITHOUT CONTRAST  TECHNIQUE: Multidetector CT imaging of the head and cervical spine was performed following the standard protocol without intravenous contrast. Multiplanar CT image reconstructions of the cervical spine were also generated. COMPARISON:  Radiographs of the cervical spine from 04/08/2017 FINDINGS: CT HEAD FINDINGS Brain: Age related involutional changes of the brain with mild-to-moderate small vessel ischemic disease of periventricular and subcortical white matter. No large vascular territory infarct, hemorrhage, midline shift or edema. No intra-axial mass nor extra-axial fluid collections. Vascular: No hyperdense vessel sign.  No unexpected calcifications. Skull: No acute skull fracture or significant scalp swelling. Sinuses/Orbits: Bilateral lens replacements. Mild ethmoid sinus mucosal thickening. Clear mastoids bilaterally. Other: None CT CERVICAL SPINE FINDINGS Alignment: Chronic facet  mediated spondylolisthesis at multiple levels of the upper cervical spine with grade 1 spondylolisthesis of C3 on C4, C4 on C5 and C5 on C6, the greatest at C5-6 measuring 2 mm. Skull base and vertebrae: No acute fracture. No primary bone lesion or focal pathologic process. Soft tissues and spinal canal: No prevertebral fluid or swelling. No visible canal hematoma. Disc levels: Cervical spondylosis with multilevel disc flattening is identified mild from C2 through C5 and marked from C5 through C7, most severely at C6-7. No jumped or perched facets. Multilevel degenerative facet arthropathy with joint space narrowing sclerosis is identified of the cervical spine. Uncinate spurring with uncovertebral joint osteoarthritis is noted on the right at C3-4, C4-5 and bilaterally at C5-6 and C6-7. Mild right-sided foraminal encroachment from C4 through C7. Upper chest: Sub segmental atelectasis and/or scarring at the apices. Other: Atherosclerosis of the carotid bifurcations bilaterally. IMPRESSION: 1. Age related involutional changes of the  brain without acute intracranial abnormality. Chronic appearing small vessel ischemic disease. 2. Redemonstration of cervical spondylosis with multilevel grade 1 spondylolisthesis from C3 through C6, greatest at C5-6 measuring 2 mm. These are believed to mediated by degenerative facet arthropathy. 3. No acute cervical spine fracture. Electronically Signed   By: Tollie Eth M.D.   On: 03/11/2018 18:16   Ct Cervical Spine Wo Contrast  Result Date: 03/11/2018 CLINICAL DATA:  Patient fell sometime yesterday and has been found on the floor since the fall. EXAM: CT HEAD WITHOUT CONTRAST CT CERVICAL SPINE WITHOUT CONTRAST TECHNIQUE: Multidetector CT imaging of the head and cervical spine was performed following the standard protocol without intravenous contrast. Multiplanar CT image reconstructions of the cervical spine were also generated. COMPARISON:  Radiographs of the cervical spine from 04/08/2017 FINDINGS: CT HEAD FINDINGS Brain: Age related involutional changes of the brain with mild-to-moderate small vessel ischemic disease of periventricular and subcortical white matter. No large vascular territory infarct, hemorrhage, midline shift or edema. No intra-axial mass nor extra-axial fluid collections. Vascular: No hyperdense vessel sign.  No unexpected calcifications. Skull: No acute skull fracture or significant scalp swelling. Sinuses/Orbits: Bilateral lens replacements. Mild ethmoid sinus mucosal thickening. Clear mastoids bilaterally. Other: None CT CERVICAL SPINE FINDINGS Alignment: Chronic facet mediated spondylolisthesis at multiple levels of the upper cervical spine with grade 1 spondylolisthesis of C3 on C4, C4 on C5 and C5 on C6, the greatest at C5-6 measuring 2 mm. Skull base and vertebrae: No acute fracture. No primary bone lesion or focal pathologic process. Soft tissues and spinal canal: No prevertebral fluid or swelling. No visible canal hematoma. Disc levels: Cervical spondylosis with multilevel disc  flattening is identified mild from C2 through C5 and marked from C5 through C7, most severely at C6-7. No jumped or perched facets. Multilevel degenerative facet arthropathy with joint space narrowing sclerosis is identified of the cervical spine. Uncinate spurring with uncovertebral joint osteoarthritis is noted on the right at C3-4, C4-5 and bilaterally at C5-6 and C6-7. Mild right-sided foraminal encroachment from C4 through C7. Upper chest: Sub segmental atelectasis and/or scarring at the apices. Other: Atherosclerosis of the carotid bifurcations bilaterally. IMPRESSION: 1. Age related involutional changes of the brain without acute intracranial abnormality. Chronic appearing small vessel ischemic disease. 2. Redemonstration of cervical spondylosis with multilevel grade 1 spondylolisthesis from C3 through C6, greatest at C5-6 measuring 2 mm. These are believed to mediated by degenerative facet arthropathy. 3. No acute cervical spine fracture. Electronically Signed   By: Tollie Eth M.D.   On: 03/11/2018 18:16   Dg  Chest Port 1 View  Result Date: 03/11/2018 CLINICAL DATA:  Fall EXAM: PORTABLE CHEST 1 VIEW COMPARISON:  Chest x-ray dated 06/20/2016. FINDINGS: Heart size and mediastinal contours appear stable. Lungs appear grossly clear, difficult to definitively characterize due to mild patient motion artifact. No pleural effusion or pneumothorax seen. No osseous fracture or dislocation seen. IMPRESSION: No acute findings. Electronically Signed   By: Bary Richard M.D.   On: 03/11/2018 18:55   Dg Hand Complete Left  Result Date: 03/11/2018 CLINICAL DATA:  Pain EXAM: LEFT HAND - COMPLETE 3+ VIEW COMPARISON:  None. FINDINGS: Advanced changes of chronic degenerative erosive osteoarthritis at the second through fourth DIP joints. Associated mild subluxations of the first, third and fourth distal phalanx. No acute appearing cortical irregularity or osseous lesion. No fracture line or displaced fracture fragment.  IMPRESSION: 1. No acute findings. 2. 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 Electronically Signed   By: Bary Richard M.D.   On: 03/11/2018 18:54   Dg Hip Unilat W Or Wo Pelvis 2-3 Views Right  Result Date: 03/11/2018 CLINICAL DATA:  Larey Seat sometime yesterday.  Leg pain. EXAM: DG HIP (WITH OR WITHOUT PELVIS) 2-3V RIGHT COMPARISON:  No comparison studies available. FINDINGS: Frontal pelvis shows demineralization without an acute fracture. SI joints and symphysis pubis unremarkable. AP and frog-leg lateral views of the right hip show no femoral neck fracture. IMPRESSION: Negative. Electronically Signed   By: Kennith Center M.D.   On: 03/11/2018 18:48    Procedures Procedures (including critical care time)  Medications Ordered in ED Medications  LORazepam (ATIVAN) injection 0-4 mg (has no administration in time range)    Or  LORazepam (ATIVAN) tablet 0-4 mg (has no administration in time range)  LORazepam (ATIVAN) injection 0-4 mg (has no administration in time range)    Or  LORazepam (ATIVAN) tablet 0-4 mg (has no administration in time range)  vancomycin (VANCOCIN) IVPB 1000 mg/200 mL premix (1,000 mg Intravenous New Bag/Given 03/11/18 2159)  sodium chloride 0.9 % bolus 500 mL (has no administration in time range)  sodium chloride 0.9 % bolus 1,000 mL (1,000 mLs Intravenous New Bag/Given 03/11/18 1705)  thiamine (B-1) injection 100 mg (100 mg Intravenous Given 03/11/18 2040)  folic acid 1 mg in sodium chloride 0.9 % 50 mL IVPB (0 mg Intravenous Stopped 03/11/18 2130)  piperacillin-tazobactam (ZOSYN) IVPB 3.375 g (0 g Intravenous Stopped 03/11/18 2128)     Initial Impression / Assessment and Plan / ED Course  I have reviewed the triage vital signs and the nursing notes.  Pertinent labs & imaging results that were available during my care of the patient were reviewed by me and  considered in my medical decision making (see chart for details).  84 year old female presents with a fall at home and evidence of dehydration in the ED.  She is tachycardic and has soft blood pressures on arrival.  On exam she has diffuse tenderness.  She is chronically ill-appearing and frail.  She has multiple pain complaints.  We will initiate labs and obtain imaging of her head, neck, back, chest, hip and hand.  Will give 1 L of fluids.  CBC is remarkable for marked leukocytosis of 27.6.  She also has anemia which is at baseline.  She has a high MCV consistent with her chronic alcohol use.  Her CMP is remarkable for a mild AKI, elevated anion gap, elevated lipase.  Her  lactic acid is elevated to 3.04.  Likely this is due to dehydration however due to her significant leukocytosis will cover her for infection as well.  Her alcohol level is also mildly elevated. Her CK is normal. Shared visit with Dr. Erma Heritage.  She does have generalized abdominal tenderness as well.  Although she is tender all over we will obtain a CT abdomen pelvis due to her advanced age and abnormal labs.  CT abdomen and pelvis is negative.  She was covered with vancomycin and Zosyn for infection.  The patient's daughter and son are now at bedside.  They were updated on the plan.  They are in agreement.  Urine is pending.  Discussed with Dr. Clearnce Sorrel who will admit.  Final Clinical Impressions(s) / ED Diagnoses   Final diagnoses:  Fall, initial encounter  AKI (acute kidney injury) (HCC)  Elevated lipase  Leukocytosis, unspecified type    ED Discharge Orders    None       Bethel Born, PA-C 03/11/18 2304    Shaune Pollack, MD 03/14/18 1255

## 2018-03-11 NOTE — ED Notes (Signed)
Pt placed on Purewick. Notified that urine sample is needed

## 2018-03-11 NOTE — ED Notes (Signed)
Pt has purwik placed and we are awaiting urine for sample.  RN aware.

## 2018-03-11 NOTE — ED Triage Notes (Addendum)
Per EMS: Pt coming from home c/o fall sometime yesterday and has been on the floor since fall. Ems noted pt's breath smelled like possible alcohol. Pt reports she "drinks Christiane Ha daily." Reports lidocaine patches for chronic back pain. A&O x4.   100 mcg of Fentanyl given by EMS Central tremors at baseline

## 2018-03-11 NOTE — ED Notes (Addendum)
Notified PA, [place], pt. I-stat CG4 Lactic acid results 3.04 and RN,Bobby made aware.

## 2018-03-11 NOTE — ED Notes (Signed)
RN reviewed note and triage info with Maralyn Sago, RN

## 2018-03-12 ENCOUNTER — Inpatient Hospital Stay (HOSPITAL_COMMUNITY): Payer: Medicare Other

## 2018-03-12 DIAGNOSIS — N179 Acute kidney failure, unspecified: Secondary | ICD-10-CM

## 2018-03-12 DIAGNOSIS — J309 Allergic rhinitis, unspecified: Secondary | ICD-10-CM

## 2018-03-12 DIAGNOSIS — W19XXXA Unspecified fall, initial encounter: Secondary | ICD-10-CM

## 2018-03-12 DIAGNOSIS — R6510 Systemic inflammatory response syndrome (SIRS) of non-infectious origin without acute organ dysfunction: Secondary | ICD-10-CM

## 2018-03-12 DIAGNOSIS — T17800D Unspecified foreign body in other parts of respiratory tract causing asphyxiation, subsequent encounter: Secondary | ICD-10-CM

## 2018-03-12 DIAGNOSIS — I340 Nonrheumatic mitral (valve) insufficiency: Secondary | ICD-10-CM

## 2018-03-12 DIAGNOSIS — I361 Nonrheumatic tricuspid (valve) insufficiency: Secondary | ICD-10-CM

## 2018-03-12 DIAGNOSIS — K219 Gastro-esophageal reflux disease without esophagitis: Secondary | ICD-10-CM

## 2018-03-12 DIAGNOSIS — Y92009 Unspecified place in unspecified non-institutional (private) residence as the place of occurrence of the external cause: Secondary | ICD-10-CM

## 2018-03-12 DIAGNOSIS — W19XXXD Unspecified fall, subsequent encounter: Secondary | ICD-10-CM

## 2018-03-12 DIAGNOSIS — J9601 Acute respiratory failure with hypoxia: Secondary | ICD-10-CM

## 2018-03-12 DIAGNOSIS — I4891 Unspecified atrial fibrillation: Secondary | ICD-10-CM

## 2018-03-12 DIAGNOSIS — E43 Unspecified severe protein-calorie malnutrition: Secondary | ICD-10-CM

## 2018-03-12 DIAGNOSIS — D72829 Elevated white blood cell count, unspecified: Secondary | ICD-10-CM

## 2018-03-12 DIAGNOSIS — R748 Abnormal levels of other serum enzymes: Secondary | ICD-10-CM

## 2018-03-12 LAB — URINALYSIS, ROUTINE W REFLEX MICROSCOPIC
Bilirubin Urine: NEGATIVE
Glucose, UA: NEGATIVE mg/dL
Ketones, ur: NEGATIVE mg/dL
Nitrite: NEGATIVE
Protein, ur: NEGATIVE mg/dL
Specific Gravity, Urine: 1.014 (ref 1.005–1.030)
pH: 5 (ref 5.0–8.0)

## 2018-03-12 LAB — CBC
HCT: 26.5 % — ABNORMAL LOW (ref 36.0–46.0)
Hemoglobin: 9.2 g/dL — ABNORMAL LOW (ref 12.0–15.0)
MCH: 36.2 pg — ABNORMAL HIGH (ref 26.0–34.0)
MCHC: 34.7 g/dL (ref 30.0–36.0)
MCV: 104.3 fL — ABNORMAL HIGH (ref 78.0–100.0)
Platelets: 138 10*3/uL — ABNORMAL LOW (ref 150–400)
RBC: 2.54 MIL/uL — ABNORMAL LOW (ref 3.87–5.11)
RDW: 13.6 % (ref 11.5–15.5)
WBC: 17.3 K/uL — ABNORMAL HIGH (ref 4.0–10.5)

## 2018-03-12 LAB — PROCALCITONIN
Procalcitonin: 0.17 ng/mL
Procalcitonin: 0.23 ng/mL

## 2018-03-12 LAB — HEPATIC FUNCTION PANEL
ALBUMIN: 2.6 g/dL — AB (ref 3.5–5.0)
ALK PHOS: 58 U/L (ref 38–126)
ALT: 21 U/L (ref 14–54)
AST: 45 U/L — AB (ref 15–41)
BILIRUBIN TOTAL: 0.9 mg/dL (ref 0.3–1.2)
Bilirubin, Direct: 0.2 mg/dL (ref 0.1–0.5)
Indirect Bilirubin: 0.7 mg/dL (ref 0.3–0.9)
Total Protein: 5.3 g/dL — ABNORMAL LOW (ref 6.5–8.1)

## 2018-03-12 LAB — IRON AND TIBC
IRON: 129 ug/dL (ref 28–170)
SATURATION RATIOS: 59 % — AB (ref 10.4–31.8)
TIBC: 217 ug/dL — AB (ref 250–450)
UIBC: 88 ug/dL

## 2018-03-12 LAB — ECHOCARDIOGRAM COMPLETE
Height: 62 in
Weight: 1600 oz

## 2018-03-12 LAB — LIPASE, BLOOD: Lipase: 154 U/L — ABNORMAL HIGH (ref 11–51)

## 2018-03-12 LAB — BASIC METABOLIC PANEL
BUN: 44 mg/dL — ABNORMAL HIGH (ref 6–20)
CO2: 16 mmol/L — ABNORMAL LOW (ref 22–32)
Calcium: 7.8 mg/dL — ABNORMAL LOW (ref 8.9–10.3)
Creatinine, Ser: 1.72 mg/dL — ABNORMAL HIGH (ref 0.44–1.00)
GFR calc Af Amer: 30 mL/min — ABNORMAL LOW (ref >60)
Glucose, Bld: 145 mg/dL — ABNORMAL HIGH (ref 65–99)

## 2018-03-12 LAB — FERRITIN: FERRITIN: 367 ng/mL — AB (ref 11–307)

## 2018-03-12 LAB — BASIC METABOLIC PANEL WITH GFR
Anion gap: 13 (ref 5–15)
Chloride: 103 mmol/L (ref 101–111)
GFR calc non Af Amer: 26 mL/min — ABNORMAL LOW (ref >60)
Potassium: 3.9 mmol/L (ref 3.5–5.1)
Sodium: 132 mmol/L — ABNORMAL LOW (ref 135–145)

## 2018-03-12 LAB — RETICULOCYTES
RBC.: 2.53 MIL/uL — ABNORMAL LOW (ref 3.87–5.11)
RETIC CT PCT: 1.2 % (ref 0.4–3.1)
Retic Count, Absolute: 30.4 10*3/uL (ref 19.0–186.0)

## 2018-03-12 LAB — VITAMIN B12: Vitamin B-12: 1072 pg/mL — ABNORMAL HIGH (ref 180–914)

## 2018-03-12 LAB — FOLATE: Folate: 19.3 ng/mL (ref >5.9)

## 2018-03-12 MED ORDER — SODIUM CHLORIDE 0.9 % IV BOLUS
500.0000 mL | Freq: Once | INTRAVENOUS | Status: AC
  Administered 2018-03-12: 500 mL via INTRAVENOUS

## 2018-03-12 MED ORDER — THIAMINE HCL 100 MG/ML IJ SOLN
100.0000 mg | Freq: Every day | INTRAMUSCULAR | Status: DC
  Filled 2018-03-12: qty 2

## 2018-03-12 MED ORDER — TRAMADOL HCL 50 MG PO TABS
25.0000 mg | ORAL_TABLET | Freq: Four times a day (QID) | ORAL | Status: DC | PRN
  Administered 2018-03-12 – 2018-03-17 (×6): 25 mg via ORAL
  Filled 2018-03-12 (×6): qty 1

## 2018-03-12 MED ORDER — VITAMIN B-12 1000 MCG PO TABS
1000.0000 ug | ORAL_TABLET | Freq: Every day | ORAL | Status: DC
  Administered 2018-03-12 – 2018-03-18 (×7): 1000 ug via ORAL
  Filled 2018-03-12 (×8): qty 1

## 2018-03-12 MED ORDER — VITAMIN D (ERGOCALCIFEROL) 1.25 MG (50000 UNIT) PO CAPS
50000.0000 [IU] | ORAL_CAPSULE | ORAL | Status: DC
  Filled 2018-03-12: qty 1

## 2018-03-12 MED ORDER — ONDANSETRON HCL 4 MG/2ML IJ SOLN
4.0000 mg | Freq: Four times a day (QID) | INTRAMUSCULAR | Status: DC | PRN
Start: 2018-03-12 — End: 2018-03-18

## 2018-03-12 MED ORDER — ACETAMINOPHEN 325 MG PO TABS
650.0000 mg | ORAL_TABLET | Freq: Four times a day (QID) | ORAL | Status: DC | PRN
  Administered 2018-03-12 – 2018-03-18 (×3): 650 mg via ORAL
  Filled 2018-03-12 (×3): qty 2

## 2018-03-12 MED ORDER — FOLIC ACID 1 MG PO TABS
1.0000 mg | ORAL_TABLET | Freq: Every day | ORAL | Status: DC
  Administered 2018-03-12 – 2018-03-18 (×7): 1 mg via ORAL
  Filled 2018-03-12 (×7): qty 1

## 2018-03-12 MED ORDER — FLUTICASONE PROPIONATE 50 MCG/ACT NA SUSP
1.0000 | Freq: Every day | NASAL | Status: DC
  Administered 2018-03-12 – 2018-03-18 (×6): 1 via NASAL
  Filled 2018-03-12: qty 16

## 2018-03-12 MED ORDER — ADULT MULTIVITAMIN W/MINERALS CH
1.0000 | ORAL_TABLET | Freq: Every day | ORAL | Status: DC
  Administered 2018-03-12 – 2018-03-18 (×7): 1 via ORAL
  Filled 2018-03-12 (×7): qty 1

## 2018-03-12 MED ORDER — METOPROLOL TARTRATE 25 MG PO TABS
12.5000 mg | ORAL_TABLET | Freq: Two times a day (BID) | ORAL | Status: DC
  Administered 2018-03-12 – 2018-03-15 (×9): 12.5 mg via ORAL
  Filled 2018-03-12 (×9): qty 1

## 2018-03-12 MED ORDER — JUVEN PO PACK
1.0000 | PACK | Freq: Two times a day (BID) | ORAL | Status: DC
Start: 2018-03-12 — End: 2018-03-18
  Administered 2018-03-15 – 2018-03-18 (×5): 1 via ORAL
  Filled 2018-03-12 (×13): qty 1

## 2018-03-12 MED ORDER — TIMOLOL MALEATE 0.25 % OP SOLN
1.0000 [drp] | Freq: Two times a day (BID) | OPHTHALMIC | Status: DC
  Administered 2018-03-12 – 2018-03-18 (×13): 1 [drp] via OPHTHALMIC
  Filled 2018-03-12: qty 5

## 2018-03-12 MED ORDER — ACETAMINOPHEN 650 MG RE SUPP: 650.0000 mg | Freq: Four times a day (QID) | RECTAL | Status: DC | PRN

## 2018-03-12 MED ORDER — ENOXAPARIN SODIUM 30 MG/0.3ML ~~LOC~~ SOLN
30.0000 mg | SUBCUTANEOUS | Status: DC
  Administered 2018-03-12 – 2018-03-18 (×7): 30 mg via SUBCUTANEOUS
  Filled 2018-03-12 (×6): qty 0.3

## 2018-03-12 MED ORDER — THIAMINE HCL 100 MG/ML IJ SOLN: 100.0000 mg | Freq: Every day | INTRAMUSCULAR | Status: DC

## 2018-03-12 MED ORDER — THIAMINE HCL 100 MG/ML IJ SOLN
100.0000 mg | Freq: Every day | INTRAMUSCULAR | Status: DC
  Administered 2018-03-12: 100 mg via INTRAVENOUS

## 2018-03-12 MED ORDER — SODIUM CHLORIDE 0.9 % IV SOLN
INTRAVENOUS | Status: DC
  Administered 2018-03-12 (×2): via INTRAVENOUS

## 2018-03-12 MED ORDER — VITAMIN B-6 100 MG PO TABS
100.0000 mg | ORAL_TABLET | Freq: Every day | ORAL | Status: DC
  Administered 2018-03-12 – 2018-03-18 (×7): 100 mg via ORAL
  Filled 2018-03-12 (×7): qty 1

## 2018-03-12 MED ORDER — DOCUSATE SODIUM 100 MG PO CAPS
100.0000 mg | ORAL_CAPSULE | Freq: Two times a day (BID) | ORAL | Status: DC
  Administered 2018-03-12 – 2018-03-18 (×13): 100 mg via ORAL
  Filled 2018-03-12 (×13): qty 1

## 2018-03-12 MED ORDER — AZELASTINE HCL 0.1 % NA SOLN
1.0000 | Freq: Two times a day (BID) | NASAL | Status: DC
  Administered 2018-03-12 – 2018-03-18 (×13): 1 via NASAL
  Filled 2018-03-12: qty 30

## 2018-03-12 MED ORDER — ONDANSETRON HCL 4 MG PO TABS: 4.0000 mg | ORAL_TABLET | Freq: Four times a day (QID) | ORAL | Status: DC | PRN

## 2018-03-12 MED ORDER — LIDOCAINE 5 % EX PTCH
1.0000 | MEDICATED_PATCH | Freq: Every day | CUTANEOUS | Status: DC
  Administered 2018-03-12 – 2018-03-18 (×7): 1 via TRANSDERMAL
  Filled 2018-03-12 (×7): qty 1

## 2018-03-12 MED ORDER — POLYETHYLENE GLYCOL 3350 17 G PO PACK: 17.0000 g | PACK | Freq: Every day | ORAL | Status: DC | PRN

## 2018-03-12 MED ORDER — VITAMIN B-1 100 MG PO TABS: 100.0000 mg | ORAL_TABLET | Freq: Every day | ORAL | Status: DC

## 2018-03-12 NOTE — Progress Notes (Signed)
Patient has skin tears and abrasions scattered all over. Foam applied to R arm and elbow, back, bottom and heels.

## 2018-03-12 NOTE — Progress Notes (Signed)
BP trending low 87/41, patient lethargic but arousable after CIWA dose of ativan. MD notified, orders received for bolus, BP increased to 116/72 after bolus.  Earnest Conroy. Clelia Croft, RN

## 2018-03-12 NOTE — Progress Notes (Signed)
Initial Nutrition Assessment  DOCUMENTATION CODES:   Severe malnutrition in context of chronic illness, Underweight  INTERVENTION:  - Will order Juven BID, each packet provides 80 calories and 14 grams of amino acids. - Continue to encourage PO intakes.    NUTRITION DIAGNOSIS:   Severe Malnutrition related to chronic illness as evidenced by severe fat depletion, severe muscle depletion.  GOAL:   Patient will meet greater than or equal to 90% of their needs  MONITOR:   PO intake, Weight trends, Labs, Supplement acceptance  REASON FOR ASSESSMENT:   Consult Other (Comment)(FTT)  ASSESSMENT:   84 y.o. female with medical history significant of multiple allergies, CHF, atrial fibrillation. She came to the ED after a fall and found to have AKI, elevated lactate and elevated white blood cell count concerning for sepsis. Patient is a very poor historian. She reports that she fell approximately the day before admission with a mechanical fall while walking between rooms. At home she had to use a cane or walker intermittently. She reports falling and having severe pain everywhere, but it was particularly worse in her neck where she has chronic pain from cervical osteoarthritis.  No intakes documented since admission. Pt noted to be a/o to self and place. Pt sleeping during RD visit and all information was provided by her son and daughter, who were at bedside. Pt lives alone and has a hard time opening items, cutting items, and is unable to cook on the stove but can use the microwave. Her children live in [place] and visit her every 2 weeks and during that time provide her with meals to last for "awhile." Pt also has snack foods and frozen dinners at home but she gets tired of the snack foods and does not care for the frozen dinners. Her daughter has purchased oral nutrition supplements such as Orgain for her but pt does not like them. Talked with daughter and son about adding unflavored  protein powder to foods at home and they are very open to this suggestion. They deny pt having any chewing or swallowing difficulties, but state that tremors prevent pt from being able to cut items well and she sometimes has difficulty getting items onto a fork or spoon and into her mouth.   NFPE outlined below. Daughter provided RD with paper records of pt's weights from 1 year ago. On 02/06/17 pt weighed 105 lbs, on 03/02/17 she weighed 93 lbs, and on 03/10/17 she weighed 90 lbs. Pt has gained some weight back since that time. Daughter showed RD that pt had a gastric emptying study around that time which showed that pt's stomach did not fully empty contents. Family decided against follow-up and pursuing any treatment options for this.   Medications reviewed; 100 mg Colace BID, 1 mg folic acid/day, daily multivitamin with minerals, 100 mg oral vitamin B6/day, 100 mg IV thiamine x1 yesterday and x2 today, 1000 mcg oral vitamin B12/day, 50000 units Drisdol every 7 days.  Labs reviewed; Na: 132 mmol/L, BUN: 44 mg/dL, creatinine: 1.61 mg/dL, Ca: 7.8 mg/dL, GFR: 26 mL/min.  IVF: NS @ 75 mL/hr.     NUTRITION - FOCUSED PHYSICAL EXAM:    Most Recent Value  Orbital Region  Unable to assess  Upper Arm Region  Severe depletion  Thoracic and Lumbar Region  Unable to assess  Buccal Region  Severe depletion  Temple Region  Unable to assess  Clavicle Bone Region  Severe depletion  Clavicle and Acromion Bone Region  Severe depletion  Scapular  Bone Region  Unable to assess  Dorsal Hand  Moderate depletion  Patellar Region  Unable to assess  Anterior Thigh Region  Unable to assess  Posterior Calf Region  Unable to assess  Hair  Reviewed  Eyes  Unable to assess  Mouth  Unable to assess  Skin  Reviewed  Nails  Reviewed       Diet Order:   Diet Order           Diet regular Room service appropriate? Yes; Fluid consistency: Thin  Diet effective now          EDUCATION NEEDS:   Education needs have  been addressed  Skin:  Skin Assessment: Reviewed RN Assessment  Last BM:  PTA/unknown  Height:   Ht Readings from Last 1 Encounters:  03/11/18  (1.575 m)    Weight:   Wt Readings from Last 1 Encounters:  03/11/18 100 lb (45.4 kg)    Ideal Body Weight:  50 kg  BMI:  Body mass index is 18.29 kg/m.  Estimated Nutritional Needs:   Kcal:  1360-1590 (30-35 kcal/kg)  Protein:  55-65 grams  Fluid:  >/= 1.7 L/day      Trenton Gammon, MS, RD, LDN, [facility] Inpatient Clinical Dietitian Pager # [phone] After hours/weekend pager # [phone]

## 2018-03-12 NOTE — Progress Notes (Signed)
Paged doctor as patient is requesting lidocaine patch for her neck. Awaiting response/new orders.

## 2018-03-12 NOTE — Progress Notes (Signed)
2D Echocardiogram has been performed.  Wendy Patterson 03/12/2018, 2:14 PM

## 2018-03-12 NOTE — Progress Notes (Signed)
PROGRESS NOTE    Wendy Patterson  XLK:[id number] DOB: 26-Aug-1931 DOA: 03/11/2018 PCP: Wendy Mask, MD   Brief Narrative: Wendy Patterson is a 84 y.o. female with medical history significant of multiple allergies, chronic diastolic heart failure, paroxysmal atrial fibrillation who comes into the emergency department after a fall and found to have AKI, elevated lactate and elevated white blood cell count concerning for sepsis.  Patient is a very poor historian.  She reports that she fell approximately yesterday with a mechanical fall while walking between rooms.  At home she had to use a cane or walker intermittently.  She lives alone except for a hairdresser who comes to check in on her.  She denies any chest pain, palpitations, syncope/presyncope during this time.  She reports falling and having severe pain everywhere it was particularly worse in her neck where she has chronic pain from cervical osteoarthritis.  She denies any loss of continence of stool or bladder.      In the ED patient's vitals were notable for mild tachypnea which resolved.  Patient received empiric antibiotics fluids.  Of note patient's ethanol level was noted to be 14.  While being evaluated patient had a brief a symptomatic run of A. fib with RVR with heart rate in the 170s. Admitted for a Fall and Concern for EtOH Withdrawal.  Assessment & Plan:   Principal Problem:   AKI (acute kidney injury) (HCC) Active Problems:   Allergic rhinitis   Afib (HCC)   GERD (gastroesophageal reflux disease)   Fall at home, initial encounter   Sepsis [facility])   Atrial fibrillation with rapid ventricular response (HCC)   Protein-calorie malnutrition, severe  Acute Fall -Unclear if this fall is mechanical in nature not particularly with her brief run of atrial fibrillation with RVR.  Certainly a contributing factor is her surreptitious alcohol use for which she denies significant use. -Fortunately at this time she does not have any clear  signs or symptoms sequelae from the fall other than contusions. -Physical Therapy consult recommending SNF -Social Work Consulted for assistance with placement   AKI -In the setting of diminished p.o. intake and significant amounts of alcohol intake.   -She has been taking bumetanide for her chronic diastolic heart failure and she reports taking this up to 5 times a day before too much. -Continue to Hold diuretics -C/w Gentle IV fluids at 75 mL's an hour x1 day -Avoid Nephrotoxic Medications currently  -Foley Catheter Placed -BUN/Cr went from 51/1.63 -> 44/1.72 -Continue to Monitor and repeat CMP in AM  Acute Urinary Retention -Bladder Scan Showed 819 mL of Urine -Foley Catheter placed  Alcohol Use/Abuse with Concern for Withdrawl -Unclear precisely how much she is drinking however suspect is more than she is letting on.   -She does have an elevated MCV suggesting at least some degree of alcoholism  -CIWA with IV Lorazepam -Continue with Multivitamin, Folic acid and Thiamine  Elevated MCV -Patient is on B12 supplementation however suspect this is also related to alcohol ingestion. -Continue B12 supplementation with 1000 mcg po Daily   SIRS -At this time there is no clear source for infection. WBC went from 27.6 -> 17.3 -Procalcitonin went from 0.17 -> 0.23 -She has not had any fevers or any other localizing signs or symptoms.   -LA was 3.04 and trended down to 2.58 -She has received empiric antibiotics including the emergency department. -Follow-up blood cultures from 03/11/2018 showed NGTD < 12 hours -We will hold on antibiotics at  this time  Paroxysmal atrial fibrillation with RVR:  -Patient apparently has a known history of paroxysmal atrial fibrillation however she opted not to pursue anticoagulation and was just on aspirin which was subsequently removed due to GI upset. -Echocardiogram showed EF of 55 to 60% with grade 1 diastolic dysfunction and essentially no change  since 2017 -Started metoprolol tartrate 12.5 mg twice daily -We will readdress anticoagulation possibly with ASA but due to recent falls will hold off on NOAC or Coumadin  Chronic Diastolic Heart Failure -Currently not Decompensated -Noted on Echo on 07/15/2016 -Repeat echocardiogram this admission showed EF of 55 to 60% with grade 1 diastolic dysfunction -Continue to Hold diuretics give gentle IV fluid hydration with normal saline at 75 mils per hour  Chronic Neck Pain -Due to osteoarthritis of the cervical spine. -C/w Acetaminophen 650 mg p.o. every 6 hours as needed for mild pain -Continue with Tramadol 25 mg p.o. every 6 as needed moderate pain -Discontinue NSAIDs  Severe protein calorie malnutrition in the context of chronic illness -Nutritionist consulted and appreciate recommendations -Continue with Juven twice daily and encourage p.o. intake  Macrocytic Anemia -Patient's Hb/Hct went from 11.4/33.2 -> 9.2/26.5 -Doing a panel showed iron level of 129, TIBC of 88, TIBC of 217, saturation ratio of 59%, Ferritin level of 367, Folate level of 19.3, and Vitamin B12 level of 1072 -Continue to Monitor for S/Sx of Bleeding -Repeat CBC in AM  Thrombocytopenia -Patient's Platelet Count went from 187 -> 138 -Continue to Monitor and Repeat CBC in AM  Elevated Lipase -Mildly Elevated at 154 -Likely from Dehydration -Repeat in AM   DVT prophylaxis: Enoxaparin 30 mg sq q24h Code Status: FULL CODE Family Communication: No family present at bedside Disposition Plan: Remain Inpatient for continued Workup and Treatment   Consultants:   None   Procedures: ECHOCARDIOGRAM ------------------------------------------------------------------- Study Conclusions  - Left ventricle: The cavity size was normal. Wall thickness was   normal. Systolic function was normal. The estimated ejection   fraction was in the range of 55% to 60%. Although no diagnostic   regional wall motion  abnormality was identified, this possibility   cannot be completely excluded on the basis of this study. Doppler   parameters are consistent with abnormal left ventricular   relaxation (grade 1 diastolic dysfunction). The E/e&' ratio is   between 8-15, suggesting indeterminate LV filling pressure. - Aortic valve: Trileaflet; mildly thickened leaflets. There was   trivial regurgitation. - Mitral valve: Mildly thickened leaflets . There was mild   regurgitation. - Left atrium: The atrium was normal in size. - Tricuspid valve: There was moderate regurgitation. - Pulmonary arteries: PA peak pressure: 41 mm Hg (S). - Inferior vena cava: The vessel was normal in size. The   respirophasic diameter changes were in the normal range (>= 50%),   consistent with normal central venous pressure.  Impressions:  - Compared to a prior study in 2017, there are no significant   changes.  Antimicrobials:  Anti-infectives (From admission, onward)   Start     Dose/Rate Route Frequency Ordered Stop   03/11/18 1945  piperacillin-tazobactam (ZOSYN) IVPB 3.375 g     3.375 g 100 mL/hr over 30 Minutes Intravenous  Once 03/11/18 1942 03/11/18 2128   03/11/18 1945  vancomycin (VANCOCIN) IVPB 1000 mg/200 mL premix     1,000 mg 200 mL/hr over 60 Minutes Intravenous  Once 03/11/18 1942 03/11/18 2355     Subjective: Examination patient is having some tremors.  Wanted know when  she could go home.  Denies any chest pain, shortness of breath nausea or vomiting.  Was agitated when asked about her alcohol usage.  No other concerns or complaints at this time  Objective: Vitals:   03/12/18 0404 03/12/18 1034 03/12/18 1423 03/12/18 1605  BP: (!) 113/56 131/73 (!) 87/41 116/72  Pulse: 81 82 61   Resp: (!) 22 20 20    Temp: 97.9 F (36.6 C) 98.1 F (36.7 C)    TempSrc: Oral Oral    SpO2: 100% 100% 99%   Weight:      Height:        Intake/Output Summary (Last 24 hours) at 03/12/2018 2014 Last data filed at  03/12/2018 1700 Gross per 24 hour  Intake 2381.5 ml  Output 400 ml  Net 1981.5 ml   Filed Weights   03/11/18 1451  Weight: 45.4 kg (100 lb)   Examination: Physical Exam:  Constitutional: Frail thin Caucasian female in NAD and appears calm and comfortable Eyes: Lids and conjunctivae normal, sclerae anicteric  ENMT: External Ears, Nose appear normal. Grossly normal hearing.   Neck: Appears normal, supple, no cervical masses, normal ROM, no appreciable thyromegaly, no JVD Respiratory: Diminished to auscultation bilaterally, no wheezing, rales, rhonchi or crackles. Normal respiratory effort and patient is not tachypenic. No accessory muscle use.  Cardiovascular: Irregularly Irregular, no murmurs / rubs / gallops. S1 and S2 auscultated.  Abdomen: Soft, non-tender, non-distended. No masses palpated. No appreciable hepatosplenomegaly. Bowel sounds positive x4.  GU: Deferred. Musculoskeletal: No clubbing / cyanosis of digits/nails. No joint deformity upper and lower extremities.  Skin: Has some ecchymosis noted. No induration; Warm and dry.  Neurologic: CN 2-12 grossly intact with no focal deficits. Patient extremely tremulous  Psychiatric: Normal judgment and insight. Anxious appearing. Normal mood and appropriate affect.   Data Reviewed: I have personally reviewed following labs and imaging studies  CBC: Recent Labs  Lab 03/11/18 1522 03/12/18 0521  WBC 27.6* 17.3*  NEUTROABS 25.1*  --   HGB 11.4* 9.2*  HCT 33.2* 26.5*  MCV 104.7* 104.3*  PLT 187 138*   Basic Metabolic Panel: Recent Labs  Lab 03/11/18 1522 03/12/18 0521  NA 138 132*  K 4.3 3.9  CL 105 103  CO2 17* 16*  GLUCOSE 104* 145*  BUN 51* 44*  CREATININE 1.63* 1.72*  CALCIUM 8.9 7.8*   GFR: Estimated Creatinine Clearance: 17.5 mL/min (A) (by C-G formula based on SCr of 1.72 mg/dL (H)). Liver Function Tests: Recent Labs  Lab 03/11/18 1522 03/12/18 0521  AST 54* 45*  ALT 25 21  ALKPHOS 75 58  BILITOT 1.2  0.9  PROT 6.6 5.3*  ALBUMIN 3.2* 2.6*   Recent Labs  Lab 03/11/18 1522 03/12/18 0521  LIPASE 160* 154*   No results for input(s): AMMONIA in the last 168 hours. Coagulation Profile: No results for input(s): INR, PROTIME in the last 168 hours. Cardiac Enzymes: Recent Labs  Lab 03/11/18 1522  CKTOTAL 196   BNP (last 3 results) No results for input(s): PROBNP in the last 8760 hours. HbA1C: No results for input(s): HGBA1C in the last 72 hours. CBG: No results for input(s): GLUCAP in the last 168 hours. Lipid Profile: No results for input(s): CHOL, HDL, LDLCALC, TRIG, CHOLHDL, LDLDIRECT in the last 72 hours. Thyroid Function Tests: No results for input(s): TSH, T4TOTAL, FREET4, T3FREE, THYROIDAB in the last 72 hours. Anemia Panel: Recent Labs    03/12/18 0521 03/12/18 1217  VITAMINB12  --  1,072*  FOLATE  --  19.3  FERRITIN  --  367*  TIBC  --  217*  IRON  --  129  RETICCTPCT 1.2  --    Sepsis Labs: Recent Labs  Lab 03/11/18 1939 03/11/18 2337 03/12/18 0053 03/12/18 0521  PROCALCITON  --   --  0.17 0.23  LATICACIDVEN 3.04* 2.58*  --   --     Recent Results (from the past 240 hour(s))  Blood culture (routine x 2)     Status: None (Preliminary result)   Collection Time: 03/11/18  4:34 PM  Result Value Ref Range Status   Specimen Description   Final    BLOOD BLOOD RIGHT FOREARM Performed at [facility], [address] [place], [place] [address]    Special Requests   Final    BOTTLES DRAWN AEROBIC ONLY Blood Culture adequate volume Performed at [facility], [address], [place], [place] [address]    Culture   Final    NO GROWTH < 12 HOURS Performed at [facility], [address], [place], [place] [address]    Report Status PENDING  Incomplete  Blood culture (routine x 2)     Status: None (Preliminary result)   Collection Time: 03/11/18  7:30 PM  Result Value Ref Range Status   Specimen Description    Final    BLOOD LEFT FOREARM Performed at [facility], [address], [place], [place] [address]    Special Requests   Final    BOTTLES DRAWN AEROBIC ONLY Blood Culture adequate volume Performed at [facility], [address] [place], [place] [address]    Culture   Final    NO GROWTH < 12 HOURS Performed at [facility], [address] [place], [place] [address]    Report Status PENDING  Incomplete    Radiology Studies: Ct Abdomen Pelvis Wo Contrast  Result Date: 03/11/2018 CLINICAL DATA:  Fall with nausea and vomiting EXAM: CT ABDOMEN AND PELVIS WITHOUT CONTRAST TECHNIQUE: Multidetector CT imaging of the abdomen and pelvis was performed following the standard protocol without IV contrast. COMPARISON:  03/16/2017 FINDINGS: LOWER CHEST: Small foci of subsegmental atelectasis in the right lower lobe. HEPATOBILIARY: Normal hepatic contours and density. No intra- or extrahepatic biliary dilatation. Status post cholecystectomy. PANCREAS: Normal parenchymal contours without ductal dilatation. No peripancreatic fluid collection. SPLEEN: Normal. ADRENALS/URINARY TRACT: --Adrenal glands: Normal. --Right kidney/ureter: No hydronephrosis, nephroureterolithiasis, perinephric stranding or solid renal mass. --Left kidney/ureter: No hydronephrosis, nephroureterolithiasis, perinephric stranding or solid renal mass. --Urinary bladder: Normal for degree of distention STOMACH/BOWEL: --Stomach/Duodenum: No hiatal hernia or other gastric abnormality. Normal duodenal course. --Small bowel: No dilatation or inflammation. --Colon: No focal abnormality. --Appendix: Not visualized. No right lower quadrant inflammation or free fluid. VASCULAR/LYMPHATIC: Atherosclerotic calcification is present within the non-aneurysmal abdominal aorta, without hemodynamically significant stenosis. No abdominal or pelvic lymphadenopathy. REPRODUCTIVE: Status post hysterectomy. No adnexal mass.  MUSCULOSKELETAL. No bony spinal canal stenosis or focal osseous abnormality. OTHER: None. IMPRESSION: 1. No acute abdominal or pelvic abnormality. 2.  Aortic Atherosclerosis (ICD10-I70.0). Electronically Signed   By: Deatra Robinson M.D.   On: 03/11/2018 18:25   Dg Lumbar Spine Complete  Result Date: 03/11/2018 CLINICAL DATA:  Hip pain EXAM: LUMBAR SPINE - COMPLETE 4+ VIEW COMPARISON:  CT abdomen dated 03/11/2018. FINDINGS: Stable levoscoliosis. No evidence of acute vertebral body subluxation. No acute or suspicious osseous abnormality. Degenerative changes again noted within the mid and lower cervical spine, mild to moderate in  degree. Visualized paravertebral soft tissues are unremarkable. IMPRESSION: 1. No acute findings. 2. Stable scoliosis. 3. Degenerative changes within the mid and lower lumbar spine, mild to moderate in degree, likely related to the underlying chronic scoliosis. Comparing to the previous CT of 03/11/2018, there is at least some degree of osseous neural foramen narrowing on the LEFT at the L5-S1 level which could potentially cause nerve root impingement and LEFT lower extremity radiculopathy. If clinically indicated, consider MRI to exclude associated nerve root impingement. Electronically Signed   By: Bary Richard M.D.   On: 03/11/2018 18:52   Ct Head Wo Contrast  Result Date: 03/11/2018 CLINICAL DATA:  Patient fell sometime yesterday and has been found on the floor since the fall. EXAM: CT HEAD WITHOUT CONTRAST CT CERVICAL SPINE WITHOUT CONTRAST TECHNIQUE: Multidetector CT imaging of the head and cervical spine was performed following the standard protocol without intravenous contrast. Multiplanar CT image reconstructions of the cervical spine were also generated. COMPARISON:  Radiographs of the cervical spine from 04/08/2017 FINDINGS: CT HEAD FINDINGS Brain: Age related involutional changes of the brain with mild-to-moderate small vessel ischemic disease of periventricular and  subcortical white matter. No large vascular territory infarct, hemorrhage, midline shift or edema. No intra-axial mass nor extra-axial fluid collections. Vascular: No hyperdense vessel sign.  No unexpected calcifications. Skull: No acute skull fracture or significant scalp swelling. Sinuses/Orbits: Bilateral lens replacements. Mild ethmoid sinus mucosal thickening. Clear mastoids bilaterally. Other: None CT CERVICAL SPINE FINDINGS Alignment: Chronic facet mediated spondylolisthesis at multiple levels of the upper cervical spine with grade 1 spondylolisthesis of C3 on C4, C4 on C5 and C5 on C6, the greatest at C5-6 measuring 2 mm. Skull base and vertebrae: No acute fracture. No primary bone lesion or focal pathologic process. Soft tissues and spinal canal: No prevertebral fluid or swelling. No visible canal hematoma. Disc levels: Cervical spondylosis with multilevel disc flattening is identified mild from C2 through C5 and marked from C5 through C7, most severely at C6-7. No jumped or perched facets. Multilevel degenerative facet arthropathy with joint space narrowing sclerosis is identified of the cervical spine. Uncinate spurring with uncovertebral joint osteoarthritis is noted on the right at C3-4, C4-5 and bilaterally at C5-6 and C6-7. Mild right-sided foraminal encroachment from C4 through C7. Upper chest: Sub segmental atelectasis and/or scarring at the apices. Other: Atherosclerosis of the carotid bifurcations bilaterally. IMPRESSION: 1. Age related involutional changes of the brain without acute intracranial abnormality. Chronic appearing small vessel ischemic disease. 2. Redemonstration of cervical spondylosis with multilevel grade 1 spondylolisthesis from C3 through C6, greatest at C5-6 measuring 2 mm. These are believed to mediated by degenerative facet arthropathy. 3. No acute cervical spine fracture. Electronically Signed   By: Tollie Eth M.D.   On: 03/11/2018 18:16   Ct Cervical Spine Wo  Contrast  Result Date: 03/11/2018 CLINICAL DATA:  Patient fell sometime yesterday and has been found on the floor since the fall. EXAM: CT HEAD WITHOUT CONTRAST CT CERVICAL SPINE WITHOUT CONTRAST TECHNIQUE: Multidetector CT imaging of the head and cervical spine was performed following the standard protocol without intravenous contrast. Multiplanar CT image reconstructions of the cervical spine were also generated. COMPARISON:  Radiographs of the cervical spine from 04/08/2017 FINDINGS: CT HEAD FINDINGS Brain: Age related involutional changes of the brain with mild-to-moderate small vessel ischemic disease of periventricular and subcortical white matter. No large vascular territory infarct, hemorrhage, midline shift or edema. No intra-axial mass nor extra-axial fluid collections. Vascular: No hyperdense vessel sign.  No unexpected calcifications. Skull: No acute skull fracture or significant scalp swelling. Sinuses/Orbits: Bilateral lens replacements. Mild ethmoid sinus mucosal thickening. Clear mastoids bilaterally. Other: None CT CERVICAL SPINE FINDINGS Alignment: Chronic facet mediated spondylolisthesis at multiple levels of the upper cervical spine with grade 1 spondylolisthesis of C3 on C4, C4 on C5 and C5 on C6, the greatest at C5-6 measuring 2 mm. Skull base and vertebrae: No acute fracture. No primary bone lesion or focal pathologic process. Soft tissues and spinal canal: No prevertebral fluid or swelling. No visible canal hematoma. Disc levels: Cervical spondylosis with multilevel disc flattening is identified mild from C2 through C5 and marked from C5 through C7, most severely at C6-7. No jumped or perched facets. Multilevel degenerative facet arthropathy with joint space narrowing sclerosis is identified of the cervical spine. Uncinate spurring with uncovertebral joint osteoarthritis is noted on the right at C3-4, C4-5 and bilaterally at C5-6 and C6-7. Mild right-sided foraminal encroachment from C4  through C7. Upper chest: Sub segmental atelectasis and/or scarring at the apices. Other: Atherosclerosis of the carotid bifurcations bilaterally. IMPRESSION: 1. Age related involutional changes of the brain without acute intracranial abnormality. Chronic appearing small vessel ischemic disease. 2. Redemonstration of cervical spondylosis with multilevel grade 1 spondylolisthesis from C3 through C6, greatest at C5-6 measuring 2 mm. These are believed to mediated by degenerative facet arthropathy. 3. No acute cervical spine fracture. Electronically Signed   By: Tollie Eth M.D.   On: 03/11/2018 18:16   Dg Chest Port 1 View  Result Date: 03/11/2018 CLINICAL DATA:  Fall EXAM: PORTABLE CHEST 1 VIEW COMPARISON:  Chest x-ray dated 06/20/2016. FINDINGS: Heart size and mediastinal contours appear stable. Lungs appear grossly clear, difficult to definitively characterize due to mild patient motion artifact. No pleural effusion or pneumothorax seen. No osseous fracture or dislocation seen. IMPRESSION: No acute findings. Electronically Signed   By: Bary Richard M.D.   On: 03/11/2018 18:55   Dg Hand Complete Left  Result Date: 03/11/2018 CLINICAL DATA:  Pain EXAM: LEFT HAND - COMPLETE 3+ VIEW COMPARISON:  None. FINDINGS: Advanced changes of chronic degenerative erosive osteoarthritis at the second through fourth DIP joints. Associated mild subluxations of the first, third and fourth distal phalanx. No acute appearing cortical irregularity or osseous lesion. No fracture line or displaced fracture fragment. IMPRESSION: 1. No acute findings. 2. 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 Electronically Signed   By: Bary Richard M.D.   On: 03/11/2018 18:54   Dg Hip Unilat W Or Wo Pelvis 2-3 Views Right  Result Date: 03/11/2018 CLINICAL DATA:  Larey Seat sometime yesterday.  Leg pain. EXAM: DG HIP (WITH OR  WITHOUT PELVIS) 2-3V RIGHT COMPARISON:  No comparison studies available. FINDINGS: Frontal pelvis shows demineralization without an acute fracture. SI joints and symphysis pubis unremarkable. AP and frog-leg lateral views of the right hip show no femoral neck fracture. IMPRESSION: Negative. Electronically Signed   By: Kennith Center M.D.   On: 03/11/2018 18:48   Scheduled Meds: . azelastine  1 spray Each Nare BID  . docusate sodium  100 mg Oral BID  . enoxaparin (LOVENOX) injection  30 mg Subcutaneous Q24H  . fluticasone  1 spray Each Nare Daily  . folic acid  1 mg Oral Daily  . lidocaine  1 patch Transdermal Daily  . LORazepam  0-4 mg Intravenous Q6H   Or  . LORazepam  0-4 mg Oral  Q6H  . [START ON 03/14/2018] LORazepam  0-4 mg Intravenous Q12H   Or  . [START ON 03/14/2018] LORazepam  0-4 mg Oral Q12H  . metoprolol tartrate  12.5 mg Oral BID  . multivitamin with minerals  1 tablet Oral Daily  . nutrition supplement (JUVEN)  1 packet Oral BID BM  . pyridOXINE  100 mg Oral Daily  . thiamine injection  100 mg Intravenous Daily   Or  . thiamine injection  100 mg Intravenous Daily  . timolol  1 drop Left Eye BID  . vitamin B-12  1,000 mcg Oral Daily  . Vitamin D (Ergocalciferol)  50,000 Units Oral Q7 days   Continuous Infusions: . sodium chloride 75 mL/hr at 03/12/18 1605    LOS: 1 day   Merlene Laughter, DO [facility] Pager [phone]  If 7PM-7AM, please contact night-coverage [email or web address] Password [email or web address] 03/12/2018, 8:14 PM

## 2018-03-12 NOTE — Plan of Care (Signed)

## 2018-03-12 NOTE — Progress Notes (Signed)
Patient has not voided. Bladder scan revealed of urine. MD notified, orders received for foley catheter.  Earnest Conroy. Clelia Croft, RN

## 2018-03-12 NOTE — Evaluation (Signed)
Physical Therapy Evaluation Patient Details Name: Wendy Patterson MRN: [id number] DOB: 17-Sep-1931 Today's Date: 03/12/2018   History of Present Illness  Wendy Patterson is a 84 y.o. female with medical history significant of multiple allergies, chronic diastolic heart failure, paroxysmal atrial fibrillation adm through ED after a fall and found to have AKI, elevated lactate and elevated white blood cell count concerning for sepsis.  Clinical Impression  Pt admitted with above diagnosis. Pt currently with functional limitations due to the deficits listed below (see PT Problem List).  Pt severely deconditioned and requiring max assist for bed mobility and transfers today, recommend SNF post acute;  discussed with pt/family; dtr would eventually like to transition pt closer to her in [place] to an ALF, her mother is not fully aware of this yet--however it is a good plan for the pt;   Pt will benefit from skilled PT to increase their independence and safety with mobility to allow discharge to the venue listed below.       Follow Up Recommendations SNF    Equipment Recommendations  None recommended by PT    Recommendations for Other Services       Precautions / Restrictions Precautions Precautions: Fall Restrictions Weight Bearing Restrictions: No      Mobility  Bed Mobility Overal bed mobility: Needs Assistance Bed Mobility: Supine to Sit;Sit to Supine     Supine to sit: Max assist Sit to supine: Max assist   General bed mobility comments: cues to self assist, incr time, assist with trunk and LEs in both directions  Transfers Overall transfer level: Needs assistance Equipment used: Rolling walker (2 wheeled) Transfers: Sit to/from Stand Sit to Stand: Max assist;From elevated surface         General transfer comment: assist maintain knee flexion, with anterior-superior wt shift to stand, heavy posterior bias in standing  Ambulation/Gait             General Gait  Details: unable  Stairs            Wheelchair Mobility    Modified Rankin (Stroke Patients Only)       Balance Overall balance assessment: Needs assistance   Sitting balance-Leahy Scale: Poor   Postural control: Posterior lean   Standing balance-Leahy Scale: Zero                               Pertinent Vitals/Pain Pain Assessment: 0-10 Pain Score: 4  Pain Location: neck and back Pain Descriptors / Indicators: Sore Pain Intervention(s): Monitored during session    Home Living Family/patient expects to be discharged to:: Private residence Living Arrangements: Alone   Type of Home: House Home Access: Stairs to enter   [employer] of Steps: 3 and 3-4 more steps Home Layout: Two level;Able to live on main level with bedroom/bathroom Home Equipment: Walker - 4 wheels Additional Comments: planning to have work done in bathroom, door made wider and rails installed; still drives but not lately d/t being "shaky"; her hair dresser checks in on her; children live out of town    Prior Function Level of Independence: Independent with assistive device(s)   Gait / Transfers Assistance Needed: amb with rollator in house; sleeps in recliner           Hand Dominance        Extremity/Trunk Assessment   Upper Extremity Assessment Upper Extremity Assessment: Generalized weakness    Lower Extremity Assessment Lower Extremity  Assessment: Generalized weakness    Cervical / Trunk Assessment Cervical / Trunk Assessment: Kyphotic  Communication   Communication: No difficulties  Cognition Arousal/Alertness: Awake/alert Behavior During Therapy: WFL for tasks assessed/performed Overall Cognitive Status: Within Functional Limits for tasks assessed                                        General Comments      Exercises     Assessment/Plan    PT Assessment Patient needs continued PT services  PT Problem List Decreased  strength;Decreased activity tolerance;Decreased mobility;Pain;Decreased knowledge of use of DME       PT Treatment Interventions DME instruction;Gait training;Functional mobility training;Therapeutic activities;Therapeutic exercise;Patient/family education;Balance training    PT Goals (Current goals can be found in the Care Plan section)  Acute Rehab PT Goals Patient Stated Goal: discussed rehab, pt reluctantly agreeable; family agreeable PT Goal Formulation: With patient/family Time For Goal Achievement: 03/26/18 Potential to Achieve Goals: Good    Frequency Min 2X/week   Barriers to discharge        Co-evaluation               AM-PAC PT "6 Clicks" Daily Activity  Outcome Measure Difficulty turning over in bed (including adjusting bedclothes, sheets and blankets)?: Unable Difficulty moving from lying on back to sitting on the side of the bed? : Unable Difficulty sitting down on and standing up from a chair with arms (e.g., wheelchair, bedside commode, etc,.)?: Unable Help needed moving to and from a bed to chair (including a wheelchair)?: Total Help needed walking in hospital room?: Total Help needed climbing 3-5 steps with a railing? : Total 6 Click Score: 6    End of Session Equipment Utilized During Treatment: Gait belt Activity Tolerance: Patient limited by fatigue;Patient limited by pain Patient left: in bed;with call bell/phone within reach;with family/visitor present;with bed alarm set   PT Visit Diagnosis: Muscle weakness (generalized) (M62.81);History of falling (Z91.81);Difficulty in walking, not elsewhere classified (R26.2)    Time: 1126-1202 PT Time Calculation (min) (ACUTE ONLY): 36 min   Charges:   PT Evaluation $PT Eval Low Complexity: 1 Low $PT Eval Moderate Complexity: 1 Mod PT Treatments $Therapeutic Activity: 8-22 mins   PT G CodesDrucilla Chalet, PT Pager: [phone] 03/12/2018   [facility] 03/12/2018, 3:16 PM

## 2018-03-12 NOTE — Progress Notes (Signed)
SLP Cancellation Note  Patient Details Name: Wendy Patterson MRN: [id number] DOB: [birth year]   Cancelled treatment:       Reason Eval/Treat Not Completed: Fatigue/lethargy limiting ability to participate. Per RN, pt was given Ativan late this morning, and is currently insufficiently arousable to participate in swallow evaluation. RN reports pt tolerated meds in puree better than with liquids. CXR reveals no acute abnormality. Family was present, and were informed that SLP would continue efforts to evaluate, either today or tomorrow. RN also aware.  Delos Klich B. Murvin Natal [facility], CCC-SLP Speech Language Pathologist [phone]  Leigh Aurora 03/12/2018, 12:56 PM

## 2018-03-12 NOTE — Progress Notes (Signed)
Lovenox per Pharmacy for DVT Prophylaxis    Pharmacy has been consulted from dosing enoxaparin (lovenox) in this patient for DVT prophylaxis.  The pharmacist has reviewed pertinent labs (Hgb _11.4__; PLT_187__), patient weight (_45__kg) and renal function (CrCl_18__mL/min) and decided that enoxaparin __mg SQ Q24Hrs is appropriate for this patient.  The pharmacy department will sign off at this time.  Please reconsult pharmacy if status changes or for further issues.  Thank you  Luetta Nutting PharmD, BCPS  03/12/2018, 1:06 AM

## 2018-03-13 ENCOUNTER — Inpatient Hospital Stay (HOSPITAL_COMMUNITY): Payer: Medicare Other

## 2018-03-13 LAB — COMPREHENSIVE METABOLIC PANEL
ALT: 27 U/L (ref 14–54)
AST: 66 U/L — AB (ref 15–41)
Albumin: 2.7 g/dL — ABNORMAL LOW (ref 3.5–5.0)
Alkaline Phosphatase: 71 U/L (ref 38–126)
Anion gap: 13 (ref 5–15)
BUN: 38 mg/dL — AB (ref 6–20)
CHLORIDE: 107 mmol/L (ref 101–111)
CO2: 14 mmol/L — AB (ref 22–32)
CREATININE: 1.38 mg/dL — AB (ref 0.44–1.00)
Calcium: 8.2 mg/dL — ABNORMAL LOW (ref 8.9–10.3)
GFR calc Af Amer: 39 mL/min — ABNORMAL LOW (ref >60)
GFR, EST NON AFRICAN AMERICAN: 34 mL/min — AB (ref >60)
Glucose, Bld: 83 mg/dL (ref 65–99)
Potassium: 4.2 mmol/L (ref 3.5–5.1)
Sodium: 134 mmol/L — ABNORMAL LOW (ref 135–145)
Total Bilirubin: 1.1 mg/dL (ref 0.3–1.2)
Total Protein: 6.1 g/dL — ABNORMAL LOW (ref 6.5–8.1)

## 2018-03-13 LAB — CBC WITH DIFFERENTIAL/PLATELET
Basophils Absolute: 0 10*3/uL (ref 0.0–0.1)
Basophils Relative: 0 %
EOS PCT: 0 %
Eosinophils Absolute: 0 10*3/uL (ref 0.0–0.7)
HCT: 31.6 % — ABNORMAL LOW (ref 36.0–46.0)
Hemoglobin: 10.7 g/dL — ABNORMAL LOW (ref 12.0–15.0)
LYMPHS PCT: 3 %
Lymphs Abs: 0.6 10*3/uL — ABNORMAL LOW (ref 0.7–4.0)
MCH: 35.9 pg — ABNORMAL HIGH (ref 26.0–34.0)
MCHC: 33.9 g/dL (ref 30.0–36.0)
MCV: 106 fL — AB (ref 78.0–100.0)
MONOS PCT: 5 %
Monocytes Absolute: 1 10*3/uL (ref 0.1–1.0)
NEUTROS ABS: 18.7 10*3/uL — AB (ref 1.7–7.7)
Neutrophils Relative %: 92 %
PLATELETS: 139 10*3/uL — AB (ref 150–400)
RBC: 2.98 MIL/uL — AB (ref 3.87–5.11)
RDW: 13.6 % (ref 11.5–15.5)
WBC: 20.3 10*3/uL — AB (ref 4.0–10.5)

## 2018-03-13 LAB — MAGNESIUM: MAGNESIUM: 1.2 mg/dL — AB (ref 1.7–2.4)

## 2018-03-13 LAB — PROCALCITONIN: Procalcitonin: 0.28 ng/mL

## 2018-03-13 LAB — PHOSPHORUS: Phosphorus: 3.5 mg/dL (ref 2.5–4.6)

## 2018-03-13 LAB — LIPASE, BLOOD: Lipase: 32 U/L (ref 11–51)

## 2018-03-13 MED ORDER — SODIUM BICARBONATE 8.4 % IV SOLN
INTRAVENOUS | Status: DC
  Administered 2018-03-13 – 2018-03-14 (×2): via INTRAVENOUS
  Filled 2018-03-13 (×2): qty 150

## 2018-03-13 MED ORDER — MAGNESIUM SULFATE 4 GM/100ML IV SOLN
4.0000 g | Freq: Once | INTRAVENOUS | Status: AC
  Administered 2018-03-13: 4 g via INTRAVENOUS
  Filled 2018-03-13: qty 100

## 2018-03-13 MED ORDER — VITAMIN B-1 100 MG PO TABS
100.0000 mg | ORAL_TABLET | Freq: Every day | ORAL | Status: DC
  Administered 2018-03-13 – 2018-03-18 (×6): 100 mg via ORAL
  Filled 2018-03-13 (×6): qty 1

## 2018-03-13 NOTE — Progress Notes (Signed)
Re-visited pt to post swallow precautions in room/ review recommendations. Pt showed some confusion/ disorientation while in room, asked for me to check for package at the door, also possible visual hallucinations thinking someone was standing at the door; however, pt was easily re-oriented. RN informed. Continue to have concern for underlying neurogenic condition, see MBS note. Thank you,  Thana Ates, MA, CCC-SLP [phone]

## 2018-03-13 NOTE — Evaluation (Signed)
Clinical/Bedside Swallow Evaluation Patient Details  Name: Wendy Patterson MRN: [id number] Date of Birth: 1931/07/26  Today's Date: 03/13/2018 Time: SLP Start Time (ACUTE ONLY): 1050 SLP Stop Time (ACUTE ONLY): 1115 SLP Time Calculation (min) (ACUTE ONLY): 25 min  Past Medical History:  Past Medical History:  Diagnosis Date  . Atrial fibrillation (HCC)    Intermittent/atrial flutter -- pt reports this is not a hard finding  . CHF (congestive heart failure) (HCC)   . Glaucoma   . HTN (hypertension)   . Leg edema    Past Surgical History:  Past Surgical History:  Procedure Laterality Date  . ABDOMINAL HYSTERECTOMY    . APPENDECTOMY    . CHOLECYSTECTOMY    . GLAUCOMA SURGERY    . US ECHOCARDIOGRAPHY  06-12-2008   EF 55-60%   HPI:  Pt is an 84 y.o. female with PMH of multiple allergies, chronic diastolic heart failure, paroxysmal atrial fibrillation who comes into the emergency department after a fall and found to have AKI, elevated lactate and elevated white blood cell count concerning for sepsis, concern for EtOH withdrawal. Patient is a very poor historian. She reports that she fell approximately yesterday with a mechanical fall while walking between rooms. Nutrition following- noted pt has tremor which affects ability to cut items well/ feeding difficulty. CXR negative for acute findings. Bedside swallow eval ordered.   Assessment / Plan / Recommendation Clinical Impression  Pt did not show overt s/s of aspiration during clinical evaluation but does have symptoms concerning for a swallowing impairment. Pt took very small sips of liquid and small bites of regular solid, asking "Do I need to eat the whole thing?" Across consistencies noticed pt wincing while swallowing and multiple swallows. Pt described greatest difficulty with pills, sometimes needing to cough them back up at home; had hoarse vocal quality and she attributed this to coughing up pills. Family arrived during eval, they  shared result from EGD in 2018 indicating normal esophagus. Pt had weight loss and poor appetite in 2018 but daughter reports appetite has since improved. Recommend proceeding with MBS to objectively evaluate swallow function. Until then, dysphagia 3 diet/ thin liquids, meds crushed in puree or whole if unable to crush.  SLP Visit Diagnosis: Dysphagia, unspecified (R13.10)    Aspiration Risk  Mild aspiration risk;Moderate aspiration risk    Diet Recommendation Dysphagia 3 (Mech soft);Thin liquid   Liquid Administration via: Cup;Straw Medication Administration: Crushed with puree Supervision: Patient able to self feed;Intermittent supervision to cue for compensatory strategies Compensations: Slow rate;Small sips/bites Postural Changes: Seated upright at 90 degrees    Other  Recommendations Oral Care Recommendations: Oral care BID   Follow up Recommendations Other (comment)(TBD)      Frequency and Duration Other (Comment)(TBD)          Prognosis        Swallow Study   General HPI: Pt is an 84 y.o. female with PMH of multiple allergies, chronic diastolic heart failure, paroxysmal atrial fibrillation who comes into the emergency department after a fall and found to have AKI, elevated lactate and elevated white blood cell count concerning for sepsis, concern for EtOH withdrawal. Patient is a very poor historian. She reports that she fell approximately yesterday with a mechanical fall while walking between rooms. Nutrition following- noted pt has tremor which affects ability to cut items well/ feeding difficulty. CXR negative for acute findings. Bedside swallow eval ordered. Type of Study: Bedside Swallow Evaluation Previous Swallow Assessment: none in chart Diet Prior  to this Study: Regular;Thin liquids Temperature Spikes Noted: No Respiratory Status: Room air History of Recent Intubation: No Behavior/Cognition: Alert;Cooperative;Pleasant mood Oral Cavity Assessment: Within  Functional Limits Oral Care Completed by SLP: No Oral Cavity - Dentition: Adequate natural dentition Vision: Functional for self-feeding Self-Feeding Abilities: Able to feed self Patient Positioning: Upright in bed Baseline Vocal Quality: Hoarse Volitional Cough: Strong    Oral/Motor/Sensory Function Overall Oral Motor/Sensory Function: Within functional limits   Ice Chips Ice chips: Not tested   Thin Liquid Thin Liquid: Impaired Presentation: Cup;Straw Pharyngeal  Phase Impairments: Multiple swallows    Nectar Thick Nectar Thick Liquid: Not tested   Honey Thick Honey Thick Liquid: Not tested   Puree Puree: Within functional limits   Solid   GO   Solid: Impaired Pharyngeal Phase Impairments: Multiple swallows        Louie Flenner Cecille Aver, MA, CCC-SLP 03/13/2018,11:27 AM  [phone]

## 2018-03-13 NOTE — Plan of Care (Signed)

## 2018-03-13 NOTE — Progress Notes (Signed)
PROGRESS NOTE    Wendy Patterson  ZOX:[id number] DOB: February 26, 1931 DOA: 03/11/2018 PCP: Kaleen Mask, MD   Brief Narrative: Wendy Patterson is a 84 y.o. female with medical history significant of multiple allergies, chronic diastolic heart failure, paroxysmal atrial fibrillation who comes into the emergency department after a fall and found to have AKI, elevated lactate and elevated white blood cell count concerning for sepsis.  Patient is a very poor historian.  She reports that she fell approximately yesterday with a mechanical fall while walking between rooms.  At home she had to use a cane or walker intermittently.  She lives alone except for a hairdresser who comes to check in on her.  She denies any chest pain, palpitations, syncope/presyncope during this time.  She reports falling and having severe pain everywhere it was particularly worse in her neck where she has chronic pain from cervical osteoarthritis.  She denies any loss of continence of stool or bladder.      In the ED patient's vitals were notable for mild tachypnea which resolved.  Patient received empiric antibiotics fluids.  Of note patient's ethanol level was noted to be 14.  While being evaluated patient had a brief a symptomatic run of A. fib with RVR with heart rate in the 170s. Admitted for a Fall and Concern for EtOH Withdrawal.  Assessment & Plan:   Principal Problem:   AKI (acute kidney injury) (HCC) Active Problems:   Allergic rhinitis   Afib (HCC)   GERD (gastroesophageal reflux disease)   Fall at home, initial encounter   Sepsis [facility])   Atrial fibrillation with rapid ventricular response (HCC)   Protein-calorie malnutrition, severe   Elevated lipase   Fall   Leukocytosis   SIRS (systemic inflammatory response syndrome) (HCC)  Acute Fall -Unclear if this fall is mechanical in nature not particularly with her brief run of atrial fibrillation with RVR.  Certainly a contributing factor is her surreptitious  alcohol use for which she denies significant use. -Fortunately at this time she does not have any clear signs or symptoms sequelae from the fall other than contusions. -Physical Therapy consult recommending SNF -Social Work Consulted for assistance with placement   AKI -In the setting of diminished p.o. intake and significant amounts of alcohol intake.   -She has been taking bumetanide for her chronic diastolic heart failure and she reports taking this up to 5 times a day before too much. -Continue to Hold diuretics -C/w Gentle IV fluids at 75 mL's an hour x1 day and will change to Bicarbonate drip given metabolic Acidosis  -Avoid Nephrotoxic Medications currently  -Foley Catheter Placed -BUN/Cr went from 51/1.63 -> 44/1.72 -> 38/1.38 -Continue to Monitor and repeat CMP in AM  Acute Urinary Retention -Bladder Scan Showed 819 mL of Urine -Foley Catheter placed  Alcohol Use/Abuse with Concern for Withdrawl -Unclear precisely how much she is drinking however suspect is more than she is letting on.   -She does have an elevated MCV suggesting at least some degree of alcoholism  -AST was slightly elevated and went from 45 -> 66 -CIWA with IV Lorazepam per protocol -Continue with Multivitamin, Folic acid and Thiamine  Elevated MCV -Patient is on B12 supplementation however suspect this is also related to alcohol ingestion. -Continue B12 supplementation with 1000 mcg po Daily   SIRS -At this time there is no clear source for infection. WBC went from 27.6 -> 17.3 -> 20.3 -Procalcitonin went from 0.17 -> 0.23 -> 0.28 -She has  not had any fevers or any other localizing signs or symptoms.   -Chest x-ray showed no acute pulmonary disease -Urinalysis showed small hemoglobin, trace leukocytes, negative nitrites, many bacteria, and 0-5 WBCs not indicative of a urinary tract infection -LA was 3.04 and trended down to 2.58 -She has received empiric antibiotics including the emergency  department. -Follow-up blood cultures from 03/11/2018 showed NGTD at 2 days -We will hold on antibiotics at this time  Paroxysmal atrial fibrillation with RVR:  -Patient apparently has a known history of paroxysmal atrial fibrillation however she opted not to pursue anticoagulation and was just on aspirin which was subsequently removed due to GI upset. -Echocardiogram showed EF of 55 to 60% with grade 1 diastolic dysfunction and essentially no change since 2017 -C/w Metoprolol tartrate 12.5 mg twice daily -We will readdress anticoagulation possibly with ASA but due to recent falls will hold off on NOAC or Coumadin  Chronic Diastolic Heart Failure -Currently not Decompensated -Noted on Echo on 07/15/2016 -Repeat echocardiogram this admission showed EF of 55 to 60% with grade 1 diastolic dysfunction -Continue to Hold diuretics given gentle IV fluid hydration with normal saline at 75 mL/hr and now changed to sodium bicarbonate drip at 150 mEq with a rate of 75 cc/hr -She is +2.839 liters so far  Chronic Neck Pain -Due to Osteoarthritis of the cervical spine. -C/w Acetaminophen 650 mg p.o. every 6 hours as needed for mild pain -Continue with Tramadol 25 mg p.o. every 6 as needed moderate pain -Discontinue NSAIDs  Severe protein calorie malnutrition in the context of chronic illness -Nutritionist consulted and appreciate recommendations -Continue with Juven twice daily and encourage p.o. intake  Macrocytic Anemia -Patient's Hb/Hct went from 11.4/33.2 -> 9.2/26.5 -> 10.7/31.6 -Doing a panel showed iron level of 129, TIBC of 88, TIBC of 217, saturation ratio of 59%, Ferritin level of 367, Folate level of 19.3, and Vitamin B12 level of 1072 -Continue to Monitor for S/Sx of Bleeding -Repeat CBC in AM  Thrombocytopenia -Patient's Platelet Count went from 187 -> 138 -> 139 -Continue to Monitor and Repeat CBC in AM  Elevated Lipase -Mildly Elevated at 154 -Likely from Dehydration -Repeat  in AM   Hypomagnesemia -Patient's Magnesium level was 1.2 this morning -Replete with IV mag sulfate 4 g -Continue to monitor and replete as necessary -Repeat magnesium level in a.m.  Non-Anion Gap Metabolic Acidosis -CO2 was 14; Anion Gap was 13 -Start Bicarbonate gtt -Continue to Monitor and Repeat CMP in AM   Hyponatremia -Patient's sodium is now improving and went from 132 -> 134 -Continue with Sodium bicarbonate drip above -Repeat CMP in a.m.  Abnormal AST -Patient's AST went from 45 and increased to 66 -Suspected from alcoholism -Continue to monitor trend and if continues to worsen we will obtain an acute hepatitis panel and a right upper quadrant liver ultrasound -Repeat CMP in a.m.  DVT prophylaxis: Enoxaparin 30 mg sq q24h Code Status: FULL CODE Family Communication: Discussed with Daughter at bedside Disposition Plan: Remain Inpatient for continued Workup and Treatment  Consultants:   None   Procedures: ECHOCARDIOGRAM ------------------------------------------------------------------- Study Conclusions  - Left ventricle: The cavity size was normal. Wall thickness was   normal. Systolic function was normal. The estimated ejection   fraction was in the range of 55% to 60%. Although no diagnostic   regional wall motion abnormality was identified, this possibility   cannot be completely excluded on the basis of this study. Doppler   parameters are consistent with abnormal left ventricular  relaxation (grade 1 diastolic dysfunction). The E/e&' ratio is   between 8-15, suggesting indeterminate LV filling pressure. - Aortic valve: Trileaflet; mildly thickened leaflets. There was   trivial regurgitation. - Mitral valve: Mildly thickened leaflets . There was mild   regurgitation. - Left atrium: The atrium was normal in size. - Tricuspid valve: There was moderate regurgitation. - Pulmonary arteries: PA peak pressure: 41 mm Hg (S). - Inferior vena cava: The  vessel was normal in size. The   respirophasic diameter changes were in the normal range (>= 50%),   consistent with normal central venous pressure.  Impressions:  - Compared to a prior study in 2017, there are no significant   changes.  Antimicrobials:  Anti-infectives (From admission, onward)   Start     Dose/Rate Route Frequency Ordered Stop   03/11/18 1945  piperacillin-tazobactam (ZOSYN) IVPB 3.375 g     3.375 g 100 mL/hr over 30 Minutes Intravenous  Once 03/11/18 1942 03/11/18 2128   03/11/18 1945  vancomycin (VANCOCIN) IVPB 1000 mg/200 mL premix     1,000 mg 200 mL/hr over 60 Minutes Intravenous  Once 03/11/18 1942 03/11/18 2355     Subjective: Seen and examined at bedside and continues to have some tremors.  States that she was having some upper airway wheezing yesterday but that has improved.  No chest pain, shortness breath, nausea, vomiting.  States that she did have some trouble swallowing and had problems sleeping because she had to lay flat on her back at her neck.  No other concerns or complaints at this time.  Objective: Vitals:   03/12/18 1605 03/12/18 2050 03/13/18 0443 03/13/18 1348  BP: 116/72 131/75 140/82 123/76  Pulse:  87 88 74  Resp:  20 (!) 24 20  Temp:  (!) 97.3 F (36.3 C) (!) 97.3 F (36.3 C) 97.8 F (36.6 C)  TempSrc:  Oral Oral Oral  SpO2:  92% (!) 87% 93%  Weight:      Height:        Intake/Output Summary (Last 24 hours) at 03/13/2018 1520 Last data filed at 03/13/2018 4098 Gross per 24 hour  Intake 2760.25 ml  Output 500 ml  Net 2260.25 ml   Filed Weights   03/11/18 1451  Weight: 45.4 kg (100 lb)   Examination: Physical Exam:  Constitutional: Thin and frail elderly and chronically ill-appearing Caucasian female is currently in no acute distress appears calm Eyes: Lids and conjunctive are normal.  Sclera are anicteric ENMT: External ears and nose appear normal.  Grossly normal hearing Neck: Appears supple with no  JVD Respiratory: Diminished to auscultation bilaterally with no appreciable wheezing, rales, rhonchi.  Normal respiratory effort patient is not tachypneic or using accessory muscles to breathe Cardiovascular: Irregularly irregular but not tachycardic.  No appreciable murmurs, rubs, gallops. Abdomen: Soft, nontender, nondistended.  Bowel sounds present all 4 quadrants GU: Deferred Musculoskeletal: No contractures or cyanosis.  No joint deformities noted Skin: Warm and dry.  Has bilateral upper extremity and right lower ecchymosis and bruising. Neurologic: No nerves II through XII grossly intact with no appreciable focal deficits.  Patient is still tremulous and more so on the left compared to right. Psychiatric: Anxious appearing.  Slightly confused.  Is awake alert.  Data Reviewed: I have personally reviewed following labs and imaging studies  CBC: Recent Labs  Lab 03/11/18 1522 03/12/18 0521 03/13/18 0947  WBC 27.6* 17.3* 20.3*  NEUTROABS 25.1*  --  18.7*  HGB 11.4* 9.2* 10.7*  HCT 33.2* 26.5*  31.6*  MCV 104.7* 104.3* 106.0*  PLT 187 138* 139*   Basic Metabolic Panel: Recent Labs  Lab 03/11/18 1522 03/12/18 0521 03/13/18 0947  NA 138 132* 134*  K 4.3 3.9 4.2  CL 105 103 107  CO2 17* 16* 14*  GLUCOSE 104* 145* 83  BUN 51* 44* 38*  CREATININE 1.63* 1.72* 1.38*  CALCIUM 8.9 7.8* 8.2*  MG  --   --  1.2*  PHOS  --   --  3.5   GFR: Estimated Creatinine Clearance: 21.7 mL/min (A) (by C-G formula based on SCr of 1.38 mg/dL (H)). Liver Function Tests: Recent Labs  Lab 03/11/18 1522 03/12/18 0521 03/13/18 0947  AST 54* 45* 66*  ALT 25 21 27   ALKPHOS 75 58 71  BILITOT 1.2 0.9 1.1  PROT 6.6 5.3* 6.1*  ALBUMIN 3.2* 2.6* 2.7*   Recent Labs  Lab 03/11/18 1522 03/12/18 0521  LIPASE 160* 154*   No results for input(s): AMMONIA in the last 168 hours. Coagulation Profile: No results for input(s): INR, PROTIME in the last 168 hours. Cardiac Enzymes: Recent Labs  Lab  03/11/18 1522  CKTOTAL 196   BNP (last 3 results) No results for input(s): PROBNP in the last 8760 hours. HbA1C: No results for input(s): HGBA1C in the last 72 hours. CBG: No results for input(s): GLUCAP in the last 168 hours. Lipid Profile: No results for input(s): CHOL, HDL, LDLCALC, TRIG, CHOLHDL, LDLDIRECT in the last 72 hours. Thyroid Function Tests: No results for input(s): TSH, T4TOTAL, FREET4, T3FREE, THYROIDAB in the last 72 hours. Anemia Panel: Recent Labs    03/12/18 0521 03/12/18 1217  VITAMINB12  --  1,072*  FOLATE  --  19.3  FERRITIN  --  367*  TIBC  --  217*  IRON  --  129  RETICCTPCT 1.2  --    Sepsis Labs: Recent Labs  Lab 03/11/18 1939 03/11/18 2337 03/12/18 0053 03/12/18 0521 03/13/18 0510  PROCALCITON  --   --  0.17 0.23 0.28  LATICACIDVEN 3.04* 2.58*  --   --   --     Recent Results (from the past 240 hour(s))  Blood culture (routine x 2)     Status: None (Preliminary result)   Collection Time: 03/11/18  4:34 PM  Result Value Ref Range Status   Specimen Description   Final    BLOOD BLOOD RIGHT FOREARM Performed at [facility], [address], [place], [place] [address]    Special Requests   Final    BOTTLES DRAWN AEROBIC ONLY Blood Culture adequate volume Performed at [facility], [address], [place], [place] [address]    Culture   Final    NO GROWTH 2 DAYS Performed at [facility], [address], [place], [place] [address]    Report Status PENDING  Incomplete  Blood culture (routine x 2)     Status: None (Preliminary result)   Collection Time: 03/11/18  7:30 PM  Result Value Ref Range Status   Specimen Description   Final    BLOOD LEFT FOREARM Performed at [facility], [address], [place], [place] [address]    Special Requests   Final    BOTTLES DRAWN AEROBIC ONLY Blood Culture adequate volume Performed at [facility], [address], [place], [place] [address]    Culture   Final    NO GROWTH 2 DAYS Performed at [facility], [address] [place], [place] [address]    Report Status PENDING  Incomplete    Radiology Studies: Ct Abdomen Pelvis Wo Contrast  Result Date: 03/11/2018 CLINICAL DATA:  Fall with nausea and vomiting EXAM: CT ABDOMEN AND PELVIS WITHOUT CONTRAST TECHNIQUE: Multidetector CT imaging of the abdomen and pelvis was performed following the standard protocol without IV contrast. COMPARISON:  03/16/2017 FINDINGS: LOWER CHEST: Small foci of subsegmental atelectasis in the right lower lobe. HEPATOBILIARY: Normal hepatic contours and density. No intra- or extrahepatic biliary dilatation. Status post cholecystectomy. PANCREAS: Normal parenchymal contours without ductal dilatation. No peripancreatic fluid collection. SPLEEN: Normal. ADRENALS/URINARY TRACT: --Adrenal glands: Normal. --Right kidney/ureter: No hydronephrosis, nephroureterolithiasis, perinephric stranding or solid renal mass. --Left kidney/ureter: No hydronephrosis, nephroureterolithiasis, perinephric stranding or solid renal mass. --Urinary bladder: Normal for degree of distention STOMACH/BOWEL: --Stomach/Duodenum: No hiatal hernia or other gastric abnormality. Normal duodenal course. --Small bowel: No dilatation or inflammation. --Colon: No focal abnormality. --Appendix: Not visualized. No right lower quadrant inflammation or free fluid. VASCULAR/LYMPHATIC: Atherosclerotic calcification is present within the non-aneurysmal abdominal aorta, without hemodynamically significant stenosis. No abdominal or pelvic lymphadenopathy. REPRODUCTIVE: Status post hysterectomy. No adnexal mass. MUSCULOSKELETAL. No bony spinal canal stenosis or focal osseous abnormality. OTHER: None. IMPRESSION: 1. No acute abdominal or pelvic abnormality. 2.  Aortic Atherosclerosis (ICD10-I70.0). Electronically Signed   By: Deatra Robinson M.D.   On: 03/11/2018 18:25   Dg Lumbar Spine  Complete  Result Date: 03/11/2018 CLINICAL DATA:  Hip pain EXAM: LUMBAR SPINE - COMPLETE 4+ VIEW COMPARISON:  CT abdomen dated 03/11/2018. FINDINGS: Stable levoscoliosis. No evidence of acute vertebral body subluxation. No acute or suspicious osseous abnormality. Degenerative changes again noted within the mid and lower cervical spine, mild to moderate in degree. Visualized paravertebral soft tissues are unremarkable. IMPRESSION: 1. No acute findings. 2. Stable scoliosis. 3. Degenerative changes within the mid and lower lumbar spine, mild to moderate in degree, likely related to the underlying chronic scoliosis. Comparing to the previous CT of 03/11/2018, there is at least some degree of osseous neural foramen narrowing on the LEFT at the L5-S1 level which could potentially cause nerve root impingement and LEFT lower extremity radiculopathy. If clinically indicated, consider MRI to exclude associated nerve root impingement. Electronically Signed   By: Bary Richard M.D.   On: 03/11/2018 18:52   Ct Head Wo Contrast  Result Date: 03/11/2018 CLINICAL DATA:  Patient fell sometime yesterday and has been found on the floor since the fall. EXAM: CT HEAD WITHOUT CONTRAST CT CERVICAL SPINE WITHOUT CONTRAST TECHNIQUE: Multidetector CT imaging of the head and cervical spine was performed following the standard protocol without intravenous contrast. Multiplanar CT image reconstructions of the cervical spine were also generated. COMPARISON:  Radiographs of the cervical spine from 04/08/2017 FINDINGS: CT HEAD FINDINGS Brain: Age related involutional changes of the brain with mild-to-moderate small vessel ischemic disease of periventricular and subcortical white matter. No large vascular territory infarct, hemorrhage, midline shift or edema. No intra-axial mass nor extra-axial fluid collections. Vascular: No hyperdense vessel sign.  No unexpected calcifications. Skull: No acute skull fracture or significant scalp swelling.  Sinuses/Orbits: Bilateral lens replacements. Mild ethmoid sinus mucosal thickening. Clear mastoids bilaterally. Other: None CT CERVICAL SPINE FINDINGS Alignment: Chronic facet mediated spondylolisthesis at multiple levels of the upper cervical spine with grade 1 spondylolisthesis of C3 on C4, C4 on C5 and C5 on C6, the greatest at C5-6 measuring 2 mm. Skull base and vertebrae: No acute fracture. No primary bone lesion or focal pathologic process. Soft tissues  and spinal canal: No prevertebral fluid or swelling. No visible canal hematoma. Disc levels: Cervical spondylosis with multilevel disc flattening is identified mild from C2 through C5 and marked from C5 through C7, most severely at C6-7. No jumped or perched facets. Multilevel degenerative facet arthropathy with joint space narrowing sclerosis is identified of the cervical spine. Uncinate spurring with uncovertebral joint osteoarthritis is noted on the right at C3-4, C4-5 and bilaterally at C5-6 and C6-7. Mild right-sided foraminal encroachment from C4 through C7. Upper chest: Sub segmental atelectasis and/or scarring at the apices. Other: Atherosclerosis of the carotid bifurcations bilaterally. IMPRESSION: 1. Age related involutional changes of the brain without acute intracranial abnormality. Chronic appearing small vessel ischemic disease. 2. Redemonstration of cervical spondylosis with multilevel grade 1 spondylolisthesis from C3 through C6, greatest at C5-6 measuring 2 mm. These are believed to mediated by degenerative facet arthropathy. 3. No acute cervical spine fracture. Electronically Signed   By: Tollie Eth M.D.   On: 03/11/2018 18:16   Ct Cervical Spine Wo Contrast  Result Date: 03/11/2018 CLINICAL DATA:  Patient fell sometime yesterday and has been found on the floor since the fall. EXAM: CT HEAD WITHOUT CONTRAST CT CERVICAL SPINE WITHOUT CONTRAST TECHNIQUE: Multidetector CT imaging of the head and cervical spine was performed following the  standard protocol without intravenous contrast. Multiplanar CT image reconstructions of the cervical spine were also generated. COMPARISON:  Radiographs of the cervical spine from 04/08/2017 FINDINGS: CT HEAD FINDINGS Brain: Age related involutional changes of the brain with mild-to-moderate small vessel ischemic disease of periventricular and subcortical white matter. No large vascular territory infarct, hemorrhage, midline shift or edema. No intra-axial mass nor extra-axial fluid collections. Vascular: No hyperdense vessel sign.  No unexpected calcifications. Skull: No acute skull fracture or significant scalp swelling. Sinuses/Orbits: Bilateral lens replacements. Mild ethmoid sinus mucosal thickening. Clear mastoids bilaterally. Other: None CT CERVICAL SPINE FINDINGS Alignment: Chronic facet mediated spondylolisthesis at multiple levels of the upper cervical spine with grade 1 spondylolisthesis of C3 on C4, C4 on C5 and C5 on C6, the greatest at C5-6 measuring 2 mm. Skull base and vertebrae: No acute fracture. No primary bone lesion or focal pathologic process. Soft tissues and spinal canal: No prevertebral fluid or swelling. No visible canal hematoma. Disc levels: Cervical spondylosis with multilevel disc flattening is identified mild from C2 through C5 and marked from C5 through C7, most severely at C6-7. No jumped or perched facets. Multilevel degenerative facet arthropathy with joint space narrowing sclerosis is identified of the cervical spine. Uncinate spurring with uncovertebral joint osteoarthritis is noted on the right at C3-4, C4-5 and bilaterally at C5-6 and C6-7. Mild right-sided foraminal encroachment from C4 through C7. Upper chest: Sub segmental atelectasis and/or scarring at the apices. Other: Atherosclerosis of the carotid bifurcations bilaterally. IMPRESSION: 1. Age related involutional changes of the brain without acute intracranial abnormality. Chronic appearing small vessel ischemic disease.  2. Redemonstration of cervical spondylosis with multilevel grade 1 spondylolisthesis from C3 through C6, greatest at C5-6 measuring 2 mm. These are believed to mediated by degenerative facet arthropathy. 3. No acute cervical spine fracture. Electronically Signed   By: Tollie Eth M.D.   On: 03/11/2018 18:16   Dg Chest Port 1 View  Result Date: 03/11/2018 CLINICAL DATA:  Fall EXAM: PORTABLE CHEST 1 VIEW COMPARISON:  Chest x-ray dated 06/20/2016. FINDINGS: Heart size and mediastinal contours appear stable. Lungs appear grossly clear, difficult to definitively characterize due to mild patient motion artifact. No pleural effusion or  pneumothorax seen. No osseous fracture or dislocation seen. IMPRESSION: No acute findings. Electronically Signed   By: Bary Richard M.D.   On: 03/11/2018 18:55   Dg Hand Complete Left  Result Date: 03/11/2018 CLINICAL DATA:  Pain EXAM: LEFT HAND - COMPLETE 3+ VIEW COMPARISON:  None. FINDINGS: Advanced changes of chronic degenerative erosive osteoarthritis at the second through fourth DIP joints. Associated mild subluxations of the first, third and fourth distal phalanx. No acute appearing cortical irregularity or osseous lesion. No fracture line or displaced fracture fragment. IMPRESSION: 1. No acute findings. 2. 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 Electronically Signed   By: Bary Richard M.D.   On: 03/11/2018 18:54   Dg Hip Unilat W Or Wo Pelvis 2-3 Views Right  Result Date: 03/11/2018 CLINICAL DATA:  Larey Seat sometime yesterday.  Leg pain. EXAM: DG HIP (WITH OR WITHOUT PELVIS) 2-3V RIGHT COMPARISON:  No comparison studies available. FINDINGS: Frontal pelvis shows demineralization without an acute fracture. SI joints and symphysis pubis unremarkable. AP and frog-leg lateral views of the right hip show no femoral neck fracture. IMPRESSION: Negative.  Electronically Signed   By: Kennith Center M.D.   On: 03/11/2018 18:48   Scheduled Meds: . azelastine  1 spray Each Nare BID  . docusate sodium  100 mg Oral BID  . enoxaparin (LOVENOX) injection  30 mg Subcutaneous Q24H  . fluticasone  1 spray Each Nare Daily  . folic acid  1 mg Oral Daily  . lidocaine  1 patch Transdermal Daily  . LORazepam  0-4 mg Intravenous Q6H   Or  . LORazepam  0-4 mg Oral Q6H  . [START ON 03/14/2018] LORazepam  0-4 mg Intravenous Q12H   Or  . [START ON 03/14/2018] LORazepam  0-4 mg Oral Q12H  . metoprolol tartrate  12.5 mg Oral BID  . multivitamin with minerals  1 tablet Oral Daily  . nutrition supplement (JUVEN)  1 packet Oral BID BM  . pyridOXINE  100 mg Oral Daily  . thiamine  100 mg Oral Daily  . timolol  1 drop Left Eye BID  . vitamin B-12  1,000 mcg Oral Daily  . Vitamin D (Ergocalciferol)  50,000 Units Oral Q7 days   Continuous Infusions:   LOS: 2 days   Merlene Laughter, DO [facility] Pager [phone]  If 7PM-7AM, please contact night-coverage [email or web address] Password [email or web address] 03/13/2018, 3:20 PM

## 2018-03-13 NOTE — Progress Notes (Addendum)
Modified Barium Swallow Progress Note  Patient Details  Name: Wendy Patterson MRN: [id number] Date of Birth: 1931-05-06  Today's Date: 03/13/2018  Modified Barium Swallow completed.  Unable to load to imaging section.  Brief recommendations include the following:  Clinical Impression  Pt with a mild-moderate oropharyngeal dysphagia. Oral phase characterized by tongue tremors/ tongue pumping, weak lingual manipulation resulting in piecemeal swallowing; pharyngeal phase characterized by decreased tongue base retraction, decreased pharyngeal squeeze and reduced epiglottic inversion resulting in mild-moderate vallecula residuals across consistencies. Barium tablet taken whole with puree and cleared through esophagus without issue. No penetration/ aspiration noted; did note however that pt consumes very small bites/ sips at a time; this was observed at bedside as well. Curvature of cervical spine may have an impact on positioning and effective swallow function as well. Suspect that swallow function may have had an impact on appetite and hx of weight loss. ? Underlying neurologic condition causing tremors/ tongue pumping. Recommend continuing dysphagia 3 diet, thin liquids, meds whole in puree/ crushed if large, intermittent supervision to cue small bites/ sips. Seems to have an easier time sipping from cup but is able to sip from straw as well. Pt would benefit from continued speech therapy to include oral/ pharyngeal exercises and compensatory strategy training, in acute care and next level of care. SLP will continue to follow.   Swallow Evaluation Recommendations       SLP Diet Recommendations: Dysphagia 3 (Mech soft) solids;Thin liquid   Liquid Administration via: Cup   Medication Administration: Whole meds with puree   Supervision: Patient able to self feed;Intermittent supervision to cue for compensatory strategies   Compensations: Slow rate;Small sips/bites;Follow solids with liquid    Postural Changes: Seated upright at 90 degrees   Oral Care Recommendations: Oral care BID        Metro Kung, MA, CCC-SLP 03/13/2018,1:06 PM  [phone]

## 2018-03-14 ENCOUNTER — Inpatient Hospital Stay (HOSPITAL_COMMUNITY): Payer: Medicare Other

## 2018-03-14 LAB — COMPREHENSIVE METABOLIC PANEL
ALK PHOS: 65 U/L (ref 38–126)
ALT: 21 U/L (ref 14–54)
AST: 32 U/L (ref 15–41)
Albumin: 2.5 g/dL — ABNORMAL LOW (ref 3.5–5.0)
Anion gap: 12 (ref 5–15)
BUN: 33 mg/dL — ABNORMAL HIGH (ref 6–20)
CALCIUM: 8.3 mg/dL — AB (ref 8.9–10.3)
CO2: 20 mmol/L — ABNORMAL LOW (ref 22–32)
CREATININE: 1.18 mg/dL — AB (ref 0.44–1.00)
Chloride: 102 mmol/L (ref 101–111)
GFR calc non Af Amer: 41 mL/min — ABNORMAL LOW (ref >60)
GFR, EST AFRICAN AMERICAN: 48 mL/min — AB (ref >60)
Glucose, Bld: 186 mg/dL — ABNORMAL HIGH (ref 65–99)
Potassium: 3.1 mmol/L — ABNORMAL LOW (ref 3.5–5.1)
SODIUM: 134 mmol/L — AB (ref 135–145)
Total Bilirubin: 0.9 mg/dL (ref 0.3–1.2)
Total Protein: 5.5 g/dL — ABNORMAL LOW (ref 6.5–8.1)

## 2018-03-14 LAB — CBC WITH DIFFERENTIAL/PLATELET
Basophils Absolute: 0 10*3/uL (ref 0.0–0.1)
Basophils Relative: 0 %
Eosinophils Absolute: 0 10*3/uL (ref 0.0–0.7)
Eosinophils Relative: 0 %
HCT: 27.3 % — ABNORMAL LOW (ref 36.0–46.0)
Hemoglobin: 9.3 g/dL — ABNORMAL LOW (ref 12.0–15.0)
Lymphocytes Relative: 6 %
Lymphs Abs: 0.9 K/uL (ref 0.7–4.0)
MCH: 35.6 pg — ABNORMAL HIGH (ref 26.0–34.0)
MCHC: 34.1 g/dL (ref 30.0–36.0)
MCV: 104.6 fL — ABNORMAL HIGH (ref 78.0–100.0)
Monocytes Absolute: 0.6 K/uL (ref 0.1–1.0)
Monocytes Relative: 4 %
Neutro Abs: 13.9 10*3/uL — ABNORMAL HIGH (ref 1.7–7.7)
Neutrophils Relative %: 90 %
Platelets: 125 10*3/uL — ABNORMAL LOW (ref 150–400)
RBC: 2.61 MIL/uL — ABNORMAL LOW (ref 3.87–5.11)
RDW: 13.5 % (ref 11.5–15.5)
WBC: 15.4 10*3/uL — ABNORMAL HIGH (ref 4.0–10.5)

## 2018-03-14 LAB — PHOSPHORUS: PHOSPHORUS: 2.6 mg/dL (ref 2.5–4.6)

## 2018-03-14 LAB — MAGNESIUM: MAGNESIUM: 2.2 mg/dL (ref 1.7–2.4)

## 2018-03-14 MED ORDER — FUROSEMIDE 10 MG/ML IJ SOLN
20.0000 mg | Freq: Once | INTRAMUSCULAR | Status: AC
Start: 2018-03-14 — End: 2018-03-14
  Administered 2018-03-14: 20 mg via INTRAVENOUS
  Filled 2018-03-14: qty 2

## 2018-03-14 MED ORDER — RESOURCE THICKENUP CLEAR PO POWD
ORAL | Status: DC | PRN
Start: 2018-03-14 — End: 2018-03-18
  Filled 2018-03-14: qty 125

## 2018-03-14 MED ORDER — IPRATROPIUM BROMIDE 0.02 % IN SOLN
0.5000 mg | Freq: Four times a day (QID) | RESPIRATORY_TRACT | Status: DC
  Administered 2018-03-14: 0.5 mg via RESPIRATORY_TRACT
  Filled 2018-03-14: qty 2.5

## 2018-03-14 MED ORDER — LEVALBUTEROL HCL 0.63 MG/3ML IN NEBU
0.6300 mg | INHALATION_SOLUTION | Freq: Four times a day (QID) | RESPIRATORY_TRACT | Status: DC
  Administered 2018-03-15 – 2018-03-16 (×3): 0.63 mg via RESPIRATORY_TRACT
  Filled 2018-03-14 (×4): qty 3

## 2018-03-14 MED ORDER — POTASSIUM CHLORIDE 10 MEQ/100ML IV SOLN
10.0000 meq | INTRAVENOUS | Status: AC
  Administered 2018-03-14: 10 meq via INTRAVENOUS
  Filled 2018-03-14: qty 100

## 2018-03-14 MED ORDER — POTASSIUM CHLORIDE 10 MEQ/100ML IV SOLN
10.0000 meq | INTRAVENOUS | Status: AC
  Administered 2018-03-14 (×2): 10 meq via INTRAVENOUS
  Filled 2018-03-14 (×2): qty 100

## 2018-03-14 MED ORDER — BUMETANIDE 1 MG PO TABS
2.0000 mg | ORAL_TABLET | Freq: Every day | ORAL | Status: DC
Start: 2018-03-14 — End: 2018-03-18
  Administered 2018-03-14 – 2018-03-18 (×5): 2 mg via ORAL
  Filled 2018-03-14 (×5): qty 2

## 2018-03-14 MED ORDER — LEVALBUTEROL HCL 0.63 MG/3ML IN NEBU
0.6300 mg | INHALATION_SOLUTION | Freq: Four times a day (QID) | RESPIRATORY_TRACT | Status: DC
  Administered 2018-03-14: 0.63 mg via RESPIRATORY_TRACT
  Filled 2018-03-14: qty 3

## 2018-03-14 MED ORDER — POTASSIUM CHLORIDE CRYS ER 20 MEQ PO TBCR
40.0000 meq | EXTENDED_RELEASE_TABLET | Freq: Once | ORAL | Status: AC
  Administered 2018-03-14: 40 meq via ORAL
  Filled 2018-03-14: qty 2

## 2018-03-14 MED ORDER — IPRATROPIUM BROMIDE 0.02 % IN SOLN
0.5000 mg | Freq: Four times a day (QID) | RESPIRATORY_TRACT | Status: DC
  Administered 2018-03-15 – 2018-03-16 (×3): 0.5 mg via RESPIRATORY_TRACT
  Filled 2018-03-14 (×4): qty 2.5

## 2018-03-14 NOTE — Plan of Care (Signed)
  Problem: Elimination: Goal: Will not experience complications related to bowel motility Outcome: Progressing Goal: Will not experience complications related to urinary retention Outcome: Progressing   Problem: Pain Managment: Goal: General experience of comfort will improve Outcome: Progressing   Problem: Safety: Goal: Ability to remain free from injury will improve Outcome: Progressing   

## 2018-03-14 NOTE — Progress Notes (Signed)
  Speech Language Pathology Treatment: Dysphagia  Patient Details Name: Wendy Patterson MRN: [id number] DOB: 11/01/1931 Today's Date: 03/14/2018 Time: 4098-1191 SLP Time Calculation (min) (ACUTE ONLY): 15 min  Assessment / Plan / Recommendation Clinical Impression  Pt seen for follow-up for dysphagia; new swallow orders per MD. RN reports fluctuations in swallow function; pt has been coughing with liquids. MBS yesterday revealed no aspiration, however lingual tremors, tongue pumping noted and concern for neurogenic dysphagia. At bedside today, pt reported having a hard time "getting things down." Pt appears to be piecemealing with liquids; suspect discoordination and question premature spillage. After single sip and swallow, pt with anterior loss of approximately 1/2 teaspoon of liquid, followed by immediate coughing episode. Oral containment, coordination appear to improve with sips of nectar thick liquids; there are no overt signs of aspiration, though I do suspect pt continuing to piecemeal as multiple swallows noted. Pt declined solids. Will downgrade to dys 3, nectar thick liquids for now, continue meds crushed in puree. Will follow up for tolerance, differential diagnosis.   HPI HPI: Pt is an 84 y.o. female with PMH of multiple allergies, chronic diastolic heart failure, paroxysmal atrial fibrillation who comes into the emergency department after a fall and found to have AKI, elevated lactate and elevated white blood cell count concerning for sepsis, concern for EtOH withdrawal. Patient is a very poor historian. She reports that she fell approximately yesterday with a mechanical fall while walking between rooms. Nutrition following- noted pt has tremor which affects ability to cut items well/ feeding difficulty. CXR negative for acute findings. Bedside swallow eval ordered.      SLP Plan  Continue with current plan of care       Recommendations  Diet recommendations: Nectar-thick  liquid;Dysphagia 3 (mechanical soft) Liquids provided via: Cup;Straw Medication Administration: Crushed with puree Supervision: Patient able to self feed Compensations: Slow rate;Small sips/bites;Follow solids with liquid                Oral Care Recommendations: Oral care BID Follow up Recommendations: Skilled Nursing facility SLP Visit Diagnosis: Dysphagia, oropharyngeal phase (R13.12) Plan: Continue with current plan of care       GO              Rondel Baton, MS, CCC-SLP Speech-Language Pathologist  Arlana Lindau 03/14/2018, 4:43 PM

## 2018-03-14 NOTE — Progress Notes (Signed)
PROGRESS NOTE    Wendy Patterson  ZOX:[id number] DOB: 1930/12/08 DOA: 03/11/2018 PCP: Kaleen Mask, MD   Brief Narrative: Wendy Patterson is a 84 y.o. female with medical history significant of multiple allergies, chronic diastolic heart failure, paroxysmal atrial fibrillation who comes into the emergency department after a fall and found to have AKI, elevated lactate and elevated white blood cell count concerning for sepsis.  Patient is a very poor historian.  She reports that she fell approximately yesterday with a mechanical fall while walking between rooms.  At home she had to use a cane or walker intermittently.  She lives alone except for a hairdresser who comes to check in on her.  She denies any chest pain, palpitations, syncope/presyncope during this time.  She reports falling and having severe pain everywhere it was particularly worse in her neck where she has chronic pain from cervical osteoarthritis.  She denies any loss of continence of stool or bladder.      In the ED patient's vitals were notable for mild tachypnea which resolved.  Patient received empiric antibiotics fluids.  Of note patient's ethanol level was noted to be 14.  While being evaluated patient had a brief a symptomatic run of A. fib with RVR with heart rate in the 170s. Admitted for a Fall and Concern for EtOH Withdrawal.  Assessment & Plan:   Principal Problem:   AKI (acute kidney injury) (HCC) Active Problems:   Allergic rhinitis   Afib (HCC)   GERD (gastroesophageal reflux disease)   Fall at home, initial encounter   Sepsis [facility])   Atrial fibrillation with rapid ventricular response (HCC)   Protein-calorie malnutrition, severe   Elevated lipase   Fall   Leukocytosis   SIRS (systemic inflammatory response syndrome) (HCC)  Acute Fall -Unclear if this fall is mechanical in nature not particularly with her brief run of atrial fibrillation with RVR.  Certainly a contributing factor is her surreptitious  alcohol use for which she denies significant use. -Fortunately at this time she does not have any clear signs or symptoms sequelae from the fall other than contusions. -Physical Therapy consult recommending SNF -Social Work Consulted for assistance with placement  -Patient adamantly Refusing SNF Placement   AKI on CKD Stage 3 -Upon Review of Clinic Records patient has baseline CKD Stage 3 and GFR was 57 -In the setting of diminished p.o. intake and significant amounts of alcohol intake.   -She has been taking Bumetanide for her chronic diastolic heart failure and she reports taking this up to 5 times a day before too much. -Continued to Hold diuretics but will now resume -C/w Gentle IV fluids at 75 mL's an hour x1 day and will changed to Bicarbonate drip given metabolic Acidosis but will now stop -Avoid Nephrotoxic Medications if possible  -Foley Catheter Placed -BUN/Cr went from 51/1.63 -> 44/1.72 -> 38/1.38 -> 33/1.18 -Continue to Monitor and repeat CMP in AM  Acute Urinary Retention -Bladder Scan Showed 819 mL of Urine -Foley Catheter placed -Will need TOV and removal; Will attempt that in AM  Alcohol Use/Abuse with Concern for Withdrawl -Unclear precisely how much she is drinking however suspect is more than she is letting on.   -She does have an elevated MCV suggesting at least some degree of alcoholism  -AST was slightly elevated and went from 45 -> 66 -> 32 -CIWA with IV Lorazepam per protocol -Continue with Multivitamin, Folic acid and Thiamine  Elevated MCV -Patient is on B12 supplementation however  suspect this is also related to alcohol ingestion. -Continue B12 supplementation with 1000 mcg po Daily   SIRS -At this time there is no clear source for infection. WBC went from 27.6 -> 17.3 -> 20.3 -> 15.4 -Procalcitonin went from 0.17 -> 0.23 -> 0.28 -She has not had any fevers or any other localizing signs or symptoms.   -Chest x-ray showed no acute pulmonary disease;  Repeat CXR 03/14/18 showed Atelectasis versus consolidation LEFT lower lobe. Patchy BILATERAL perihilar infiltrates which could represent edema or infection. Small RIGHT pleural effusion (Likely this is from overhydration) -Urinalysis showed small hemoglobin, trace leukocytes, negative nitrites, many bacteria, and 0-5 WBCs not indicative of a urinary tract infection -LA was 3.04 and trended down to 2.58 -She has received empiric antibiotics including the emergency department. -Follow-up blood cultures from 03/11/2018 showed NGTD at 3 days -We will hold on antibiotics at this time  Paroxysmal Atrial Fibrillation with RVR:  -Patient apparently has a known history of paroxysmal atrial fibrillation however she opted not to pursue anticoagulation and was just on aspirin which was subsequently removed due to GI upset. -Echocardiogram showed EF of 55 to 60% with grade 1 diastolic dysfunction and essentially no change since 2017 -C/w Metoprolol tartrate 12.5 mg twice daily -We will readdress anticoagulation possibly with ASA but due to recent falls will hold off on NOAC or Coumadin for now   Acute Respiratory Failure with Hypoxia -Desaturated to79-86% this AM and had to be placed on O2 via [place] -Likely from mild Overhydration and Pulmonary Edema -C/w Supplemental O2 via [place] as Necessary and [place] as Tolerated -Stop IVF and give IV Lasix x1 and Resume Home Diuretics -Continuous Pulse Oximetry and Maintain O2 Saturations >92% -Added Flutter Valve, Incentive Spirometry -It appears that patient has been coughing when trying to eat; SLP evaluated and placed patient on Dysphagia 3 Nectar Thick Liquids  -Add Xopenex/Atrovent Scheduled  -Repeat CXR in AM   Acute on Chronic Diastolic Heart Failure -Slightly Volume Overloaded today likely from overhydration -Noted on Echo on 07/15/2016 -Repeat echocardiogram this admission showed EF of 55 to 60% with grade 1 diastolic dysfunction -Continued to Hold diuretics  given gentle IV fluid hydration with normal saline at 75 mL/hr and now changed to sodium bicarbonate drip at 150 mEq with a rate of 75 cc/hr and reduced to 50 mL/hr and now stopped given CXR findings -CXR showed Atelectasis versus consolidation LEFT lower lobe. Patchy BILATERAL perihilar infiltrates which could represent edema or infection.Small RIGHT pleural effusion. -Give IV Lasix 20 mg x1 -She is +2.840 liters so far -Resume Home Bumetanide 2 mg po Daily today  -Continue to Monitor Strict I's/O's  Chronic Neck Pain -Due to Osteoarthritis of the cervical spine. -C/w Acetaminophen 650 mg p.o. every 6 hours as needed for mild pain -Continue with Tramadol 25 mg p.o. every 6 as needed moderate pain -Discontinue NSAIDs  Severe protein calorie malnutrition in the context of chronic illness -Nutritionist consulted and appreciate recommendations -Continue with Juven twice daily and encourage p.o. intake  Macrocytic Anemia -Patient's Hb/Hct went from 11.4/33.2 -> 9.2/26.5 -> 10.7/31.6 -> 9.3/27.3 -Doing a panel showed iron level of 129, TIBC of 88, TIBC of 217, saturation ratio of 59%, Ferritin level of 367, Folate level of 19.3, and Vitamin B12 level of 1072 -Continue to Monitor for S/Sx of Bleeding -Repeat CBC in AM  Thrombocytopenia -Patient's Platelet Count went from 187 -> 138 -> 139 -> 125 -Continue to Monitor and Repeat CBC in AM  Elevated Lipase -  Mildly Elevated at 154 and improved to 32 -Likely from Dehydration -Repeat in AM   Hypomagnesemia -Patient's Magnesium level was 1.2 and repeat was 2.2 -Replete with IV mag sulfate 4 g yesterday  -Continue to monitor and replete as necessary -Repeat magnesium level in a.m.  Non-Anion Gap Metabolic Acidosis -CO2 was 14; Anion Gap was 13 but now improved as CO2 is 20 -Started Bicarbonate gtt but will stop 2/2 pulmonary edema  -Continue to Monitor and Repeat CMP in AM   Hyponatremia -Patient's sodium is now improving and went  from 132 -> 134 -Continue with Sodium bicarbonate drip above -Repeat CMP in a.m.  Abnormal AST -Patient's AST went from 45 and increased to 66 -> 32 -Suspected from Alcoholism -Continue to monitor trend and if continues to worsen we will obtain an acute hepatitis panel and a right upper quadrant liver ultrasound -Repeat CMP in a.m.  DVT prophylaxis: Enoxaparin 30 mg sq q24h Code Status: FULL CODE Family Communication: Discussed with Daughter at bedside Disposition Plan: Remain Inpatient for continued Workup and Treatment  Consultants:   None   Procedures: ECHOCARDIOGRAM ------------------------------------------------------------------- Study Conclusions  - Left ventricle: The cavity size was normal. Wall thickness was   normal. Systolic function was normal. The estimated ejection   fraction was in the range of 55% to 60%. Although no diagnostic   regional wall motion abnormality was identified, this possibility   cannot be completely excluded on the basis of this study. Doppler   parameters are consistent with abnormal left ventricular   relaxation (grade 1 diastolic dysfunction). The E/e&' ratio is   between 8-15, suggesting indeterminate LV filling pressure. - Aortic valve: Trileaflet; mildly thickened leaflets. There was   trivial regurgitation. - Mitral valve: Mildly thickened leaflets . There was mild   regurgitation. - Left atrium: The atrium was normal in size. - Tricuspid valve: There was moderate regurgitation. - Pulmonary arteries: PA peak pressure: 41 mm Hg (S). - Inferior vena cava: The vessel was normal in size. The   respirophasic diameter changes were in the normal range (>= 50%),   consistent with normal central venous pressure.  Impressions:  - Compared to a prior study in 2017, there are no significant   changes.  Antimicrobials:  Anti-infectives (From admission, onward)   Start     Dose/Rate Route Frequency Ordered Stop   03/11/18 1945   piperacillin-tazobactam (ZOSYN) IVPB 3.375 g     3.375 g 100 mL/hr over 30 Minutes Intravenous  Once 03/11/18 1942 03/11/18 2128   03/11/18 1945  vancomycin (VANCOCIN) IVPB 1000 mg/200 mL premix     1,000 mg 200 mL/hr over 60 Minutes Intravenous  Once 03/11/18 1942 03/11/18 2355     Subjective: Seen and examined at bedside per nursing report overnight she desaturated and was noticed to be coughing when trying to swallow.  No chest pain or shortness of breath and adamantly denying skilled nursing facility placement.  Does not feel like she is withdrawing from alcohol however continues to have some tremors denies hallucinating yesterday.  Objective: Vitals:   03/14/18 0425 03/14/18 0455 03/14/18 1221 03/14/18 1453  BP: 134/70   129/68  Pulse: 91   81  Resp: 16   20  Temp: 97.6 F (36.4 C)   97.7 F (36.5 C)  TempSrc: Oral   Oral  SpO2: (!) 79% 93% (!) 86% 96%  Weight:      Height:        Intake/Output Summary (Last 24 hours) at 03/14/2018 1851  Last data filed at 03/14/2018 0960 Gross per 24 hour  Intake 1476.25 ml  Output 1175 ml  Net 301.25 ml   Filed Weights   03/11/18 1451  Weight: 45.4 kg (100 lb)   Examination: Physical Exam:  Constitutional: Thin and frail elderly chronically ill-appearing Caucasian female who is currently in no acute distress but is agitated Eyes: Sclera are anicteric.  Lids and conjunctive are normal ENMT: External ears and nose appear normal.  Grossly normal hearing Neck: Supple with no JVD Respiratory: Diminished to auscultation bilaterally with mild crackles.  No appreciable wheezing, rales, rhonchi.  Respiratory effort is normal however she is going to liters supplemental oxygen via nasal cannula.  No accessory muscles to breathe Cardiovascular: Irregularly irregular  but has a controlled rate.  Trace lower extremity edema Abdomen: Soft, nontender, nondistended.  Bowel sounds present all 4 quadrants GU: Deferred Musculoskeletal: No contractures  or cyanosis.  No joint deformities noted Skin: Warm and dry bilateral upper extremity and right lower extremity ecchymosis and bruising Neurologic: Cranial nerves II through XII grossly intact no appreciable focal deficits.  Tremors are improved today Psychiatric: Agitated and anxious appearing.  Was not as confused.  Is awake and alert  Data Reviewed: I have personally reviewed following labs and imaging studies  CBC: Recent Labs  Lab 03/11/18 1522 03/12/18 0521 03/13/18 0947 03/14/18 0742  WBC 27.6* 17.3* 20.3* 15.4*  NEUTROABS 25.1*  --  18.7* 13.9*  HGB 11.4* 9.2* 10.7* 9.3*  HCT 33.2* 26.5* 31.6* 27.3*  MCV 104.7* 104.3* 106.0* 104.6*  PLT 187 138* 139* 125*   Basic Metabolic Panel: Recent Labs  Lab 03/11/18 1522 03/12/18 0521 03/13/18 0947 03/14/18 0538  NA 138 132* 134* 134*  K 4.3 3.9 4.2 3.1*  CL 105 103 107 102  CO2 17* 16* 14* 20*  GLUCOSE 104* 145* 83 186*  BUN 51* 44* 38* 33*  CREATININE 1.63* 1.72* 1.38* 1.18*  CALCIUM 8.9 7.8* 8.2* 8.3*  MG  --   --  1.2* 2.2  PHOS  --   --  3.5 2.6   GFR: Estimated Creatinine Clearance: 25.4 mL/min (A) (by C-G formula based on SCr of 1.18 mg/dL (H)). Liver Function Tests: Recent Labs  Lab 03/11/18 1522 03/12/18 0521 03/13/18 0947 03/14/18 0538  AST 54* 45* 66* 32  ALT ALKPHOS 75 58 71 65  BILITOT 1.2 0.9 1.1 0.9  PROT 6.6 5.3* 6.1* 5.5*  ALBUMIN 3.2* 2.6* 2.7* 2.5*   Recent Labs  Lab 03/11/18 1522 03/12/18 0521 03/13/18 1609  LIPASE 160* 154* 32   No results for input(s): AMMONIA in the last 168 hours. Coagulation Profile: No results for input(s): INR, PROTIME in the last 168 hours. Cardiac Enzymes: Recent Labs  Lab 03/11/18 1522  CKTOTAL 196   BNP (last 3 results) No results for input(s): PROBNP in the last 8760 hours. HbA1C: No results for input(s): HGBA1C in the last 72 hours. CBG: No results for input(s): GLUCAP in the last 168 hours. Lipid Profile: No results for input(s):  CHOL, HDL, LDLCALC, TRIG, CHOLHDL, LDLDIRECT in the last 72 hours. Thyroid Function Tests: No results for input(s): TSH, T4TOTAL, FREET4, T3FREE, THYROIDAB in the last 72 hours. Anemia Panel: Recent Labs    03/12/18 0521 03/12/18 1217  VITAMINB12  --  1,072*  FOLATE  --  19.3  FERRITIN  --  367*  TIBC  --  217*  IRON  --  129  RETICCTPCT 1.2  --  Sepsis Labs: Recent Labs  Lab 03/11/18 1939 03/11/18 2337 03/12/18 0053 03/12/18 0521 03/13/18 0510  PROCALCITON  --   --  0.17 0.23 0.28  LATICACIDVEN 3.04* 2.58*  --   --   --     Recent Results (from the past 240 hour(s))  Blood culture (routine x 2)     Status: None (Preliminary result)   Collection Time: 03/11/18  4:34 PM  Result Value Ref Range Status   Specimen Description   Final    BLOOD BLOOD RIGHT FOREARM Performed at [facility], [address], [place], [place] [address]    Special Requests   Final    BOTTLES DRAWN AEROBIC ONLY Blood Culture adequate volume Performed at [facility], [address], [place], [place] [address]    Culture   Final    NO GROWTH 3 DAYS Performed at [facility], [address] [place], [place] [address]    Report Status PENDING  Incomplete  Blood culture (routine x 2)     Status: None (Preliminary result)   Collection Time: 03/11/18  7:30 PM  Result Value Ref Range Status   Specimen Description   Final    BLOOD LEFT FOREARM Performed at [facility], [address] [place], [place] [address]    Special Requests   Final    BOTTLES DRAWN AEROBIC ONLY Blood Culture adequate volume Performed at [facility], [address] [place], [place] [address]    Culture   Final    NO GROWTH 3 DAYS Performed at [facility], [address], [place], [place] [address]    Report Status PENDING  Incomplete    Radiology Studies: Dg Chest Port 1 View  Result Date: 03/14/2018 CLINICAL DATA:   Shortness of breath, history atrial fibrillation, CHF, hypertension EXAM: PORTABLE CHEST 1 VIEW COMPARISON:  Portable exam 1047 hours compared to 03/11/2018 FINDINGS: Stable heart size mediastinal contours. BILATERAL perihilar infiltrates greater on RIGHT. Atelectasis versus consolidation LEFT lower lobe. Small RIGHT pleural effusion. No pneumothorax. Atherosclerotic calcification aorta. Bones unremarkable. IMPRESSION: Atelectasis versus consolidation LEFT lower lobe. Patchy BILATERAL perihilar infiltrates which could represent edema or infection. Small RIGHT pleural effusion. Electronically Signed   By: Ulyses Southward M.D.   On: 03/14/2018 11:25   Scheduled Meds: . azelastine  1 spray Each Nare BID  . docusate sodium  100 mg Oral BID  . enoxaparin (LOVENOX) injection  30 mg Subcutaneous Q24H  . fluticasone  1 spray Each Nare Daily  . folic acid  1 mg Oral Daily  . lidocaine  1 patch Transdermal Daily  . LORazepam  0-4 mg Intravenous Q12H   Or  . LORazepam  0-4 mg Oral Q12H  . metoprolol tartrate  12.5 mg Oral BID  . multivitamin with minerals  1 tablet Oral Daily  . nutrition supplement (JUVEN)  1 packet Oral BID BM  . pyridOXINE  100 mg Oral Daily  . thiamine  100 mg Oral Daily  . timolol  1 drop Left Eye BID  . vitamin B-12  1,000 mcg Oral Daily  . Vitamin D (Ergocalciferol)  50,000 Units Oral Q7 days   Continuous Infusions: .  sodium bicarbonate  infusion 1000 mL 50 mL/hr at 03/14/18 1030    LOS: 3 days   Merlene Laughter, DO [facility] Pager [phone]  If 7PM-7AM, please contact night-coverage [email or web address] Password [email or web address] 03/14/2018, 6:51 PM

## 2018-03-15 ENCOUNTER — Inpatient Hospital Stay (HOSPITAL_COMMUNITY): Payer: Medicare Other

## 2018-03-15 LAB — CBC WITH DIFFERENTIAL/PLATELET
Basophils Absolute: 0 10*3/uL (ref 0.0–0.1)
Basophils Relative: 0 %
EOS ABS: 0.1 10*3/uL (ref 0.0–0.7)
Eosinophils Relative: 1 %
HEMATOCRIT: 31 % — AB (ref 36.0–46.0)
HEMOGLOBIN: 10.5 g/dL — AB (ref 12.0–15.0)
LYMPHS ABS: 1 10*3/uL (ref 0.7–4.0)
Lymphocytes Relative: 7 %
MCH: 35.5 pg — AB (ref 26.0–34.0)
MCHC: 33.9 g/dL (ref 30.0–36.0)
MCV: 104.7 fL — ABNORMAL HIGH (ref 78.0–100.0)
MONO ABS: 0.9 10*3/uL (ref 0.1–1.0)
MONOS PCT: 6 %
Neutro Abs: 12.8 10*3/uL — ABNORMAL HIGH (ref 1.7–7.7)
Neutrophils Relative %: 86 %
Platelets: 108 10*3/uL — ABNORMAL LOW (ref 150–400)
RBC: 2.96 MIL/uL — ABNORMAL LOW (ref 3.87–5.11)
RDW: 13.4 % (ref 11.5–15.5)
WBC: 14.8 10*3/uL — ABNORMAL HIGH (ref 4.0–10.5)

## 2018-03-15 LAB — COMPREHENSIVE METABOLIC PANEL
ALBUMIN: 2.4 g/dL — AB (ref 3.5–5.0)
ALT: 19 U/L (ref 14–54)
ANION GAP: 12 (ref 5–15)
AST: 25 U/L (ref 15–41)
Alkaline Phosphatase: 66 U/L (ref 38–126)
BILIRUBIN TOTAL: 0.7 mg/dL (ref 0.3–1.2)
BUN: 25 mg/dL — ABNORMAL HIGH (ref 6–20)
CO2: 24 mmol/L (ref 22–32)
Calcium: 8.4 mg/dL — ABNORMAL LOW (ref 8.9–10.3)
Chloride: 102 mmol/L (ref 101–111)
Creatinine, Ser: 1.09 mg/dL — ABNORMAL HIGH (ref 0.44–1.00)
GFR calc non Af Amer: 45 mL/min — ABNORMAL LOW (ref >60)
GFR, EST AFRICAN AMERICAN: 52 mL/min — AB (ref >60)
GLUCOSE: 125 mg/dL — AB (ref 65–99)
POTASSIUM: 3.7 mmol/L (ref 3.5–5.1)
SODIUM: 138 mmol/L (ref 135–145)
Total Protein: 5.5 g/dL — ABNORMAL LOW (ref 6.5–8.1)

## 2018-03-15 LAB — PHOSPHORUS: PHOSPHORUS: 1.9 mg/dL — AB (ref 2.5–4.6)

## 2018-03-15 LAB — MAGNESIUM: Magnesium: 1.6 mg/dL — ABNORMAL LOW (ref 1.7–2.4)

## 2018-03-15 MED ORDER — MAGNESIUM SULFATE 2 GM/50ML IV SOLN
2.0000 g | Freq: Once | INTRAVENOUS | Status: AC
  Administered 2018-03-15: 2 g via INTRAVENOUS
  Filled 2018-03-15: qty 50

## 2018-03-15 MED ORDER — PIPERACILLIN-TAZOBACTAM 3.375 G IVPB
3.3750 g | Freq: Three times a day (TID) | INTRAVENOUS | Status: DC
  Administered 2018-03-15 – 2018-03-17 (×6): 3.375 g via INTRAVENOUS
  Filled 2018-03-15 (×6): qty 50

## 2018-03-15 MED ORDER — K PHOS MONO-SOD PHOS DI & MONO 155-852-130 MG PO TABS
500.0000 mg | ORAL_TABLET | Freq: Every day | ORAL | Status: AC
  Administered 2018-03-15: 500 mg via ORAL
  Filled 2018-03-15: qty 2

## 2018-03-15 NOTE — Progress Notes (Addendum)
Pharmacy Antibiotic Note  Wendy Patterson is a 84 y.o. female admitted on 03/11/2018 with aspiration pneumonia.  Pharmacy has been consulted for zosyn dosing.  Plan: Zosyn 3.375g IV q8h (4 hour infusion).  Height:  (157.5 cm) Weight: 100 lb (45.4 kg) IBW/kg (Calculated) : 50.1  Temp (24hrs), Avg:97.6 F (36.4 C), Min:97.4 F (36.3 C), Max:97.8 F (36.6 C)  Recent Labs  Lab 03/11/18 1522 03/11/18 1939 03/11/18 2337 03/12/18 0521 03/13/18 0947 03/14/18 0538 03/14/18 0742 03/15/18 0609  WBC 27.6*  --   --  17.3* 20.3*  --  15.4* 14.8*  CREATININE 1.63*  --   --  1.72* 1.38* 1.18*  --  1.09*  LATICACIDVEN  --  3.04* 2.58*  --   --   --   --   --     Estimated Creatinine Clearance: 27.5 mL/min (A) (by C-G formula based on SCr of 1.09 mg/dL (H)).    Allergies  Allergen Reactions  . Amlodipine Swelling    Caused significant leg edema   . Oxycodone Hcl Nausea Only and Other (See Comments)    Dizziness and nausea   . Sulfa Drugs Cross Reactors Other (See Comments)    Cause Asthma    Antimicrobials this admission: 5/13 zosyn >>  Dose adjustments this admission:  Microbiology results: 5/9 BCx: NGTD  Thank you for allowing pharmacy to be a part of this patient's care.  Juliette Alcide, PharmD, BCPS.   Pager: [phone] 03/15/2018 8:53 AM

## 2018-03-15 NOTE — [place] FL2 (Signed)
[place] MEDICAID FL2 LEVEL OF CARE SCREENING TOOL     IDENTIFICATION  Patient Name: Wendy Patterson Birthdate: 1931/03/01 Sex: female Admission Date (Current Location): 03/11/2018  [facility] and [place] Number:  Producer, television/film/video and Address:  [facility],  [address] [place], [place] [address]      Provider Number: [id number]  Attending Physician Name and Address:  Merlene Laughter, DO  Relative Name and Phone Number:       Current Level of Care: Hospital Recommended Level of Care: Skilled Nursing Facility Prior Approval Number:    Date Approved/Denied: 03/15/18 PASRR Number: [id number]  Discharge Plan: SNF    Current Diagnoses: Patient Active Problem List   Diagnosis Date Noted  . Protein-calorie malnutrition, severe 03/12/2018  . Elevated lipase   . Fall   . Leukocytosis   . SIRS (systemic inflammatory response syndrome) (HCC)   . Fall at home, initial encounter 03/11/2018  . AKI (acute kidney injury) (HCC) 03/11/2018  . Sepsis (HCC) 03/11/2018  . Atrial fibrillation with rapid ventricular response (HCC) 03/11/2018  . Allergic rhinitis 08/22/2016  . SOB (shortness of breath) 08/21/2016  . Afib (HCC) 04/27/2015  . GERD (gastroesophageal reflux disease) 04/27/2015    Orientation RESPIRATION BLADDER Height & Weight     Self, Place  O2 Continent Weight: 100 lb (45.4 kg) Height:   (157.5 cm)  BEHAVIORAL SYMPTOMS/MOOD NEUROLOGICAL BOWEL NUTRITION STATUS      Continent Diet(See dc summary)  AMBULATORY STATUS COMMUNICATION OF NEEDS Skin Pt seen for follow-up for dysphagia; new swallow orders per MD. RN reports fluctuations in swallow function; pt has been coughing with liquids. MBS yesterday revealed no aspiration, however lingual tremors, tongue pumping noted and concern for neurogenic dysphagia. At bedside today, pt reported having a hard time "getting things down." Pt appears to be piecemealing with liquids; suspect discoordination and question  premature spillage. After single sip and swallow, pt with anterior loss of approximately 1/2 teaspoon of liquid, followed by immediate coughing episode. Oral containment, coordination appear to improve with sips of nectar thick liquids; there are no overt signs of aspiration, though I do suspect pt continuing to piecemeal as multiple swallows noted. Pt declined solids. Will downgrade to dys 3, nectar thick liquids for now, continue meds crushed in puree. Will follow up for tolerance, differential diagnosis.    HPI HPI: Pt is an 84 y.o. female with PMH of multiple allergies, chronic diastolic heart failure, paroxysmal atrial fibrillation who comes into the emergency department after a fall and found to have AKI, elevated lactate and elevated white blood cell count concerning for sepsis, concern for EtOH withdrawal. Patient is a very poor historian. She reports that she fell approximately yesterday with a mechanical fall while walking between rooms. Nutrition following- noted pt has tremor which affects ability to cut items well/ feeding difficulty. CXR negative for acute findings. Bedside swallow eval ordered.      SLP Plan  Continue with current plan of care     Recommendations  Diet recommendations: Nectar-thick liquid;Dysphagia 3 (mechanical soft) Liquids provided via: Cup;Straw Medication Administration: Crushed with puree Supervision: Patient able to self feed Compensations: Slow rate;Small sips/bites;Follow solids with liquid               Oral Care Recommendations: Oral care BID Follow up Recommendations: Skilled Nursing facility SLP Visit Diagnosis: Dysphagia, oropharyngeal phase (R13.12) Plan: Continue with current plan of care      Extensive Assist Verbally Normal  Personal Care Assistance Level of Assistance  Bathing, Feeding, Dressing Bathing Assistance: Maximum assistance Feeding assistance: Independent Dressing Assistance:  Maximum assistance     Functional Limitations Info  Sight, Hearing, Speech Sight Info: Adequate Hearing Info: Adequate Speech Info: Adequate    SPECIAL CARE FACTORS FREQUENCY  PT (By licensed PT), OT (By licensed OT)     PT Frequency: 5x/week OT Frequency: 5x/week            Contractures Contractures Info: Not present    Additional Factors Info  Code Status, Allergies Code Status Info: Full Allergies Info: Amlodipine, Oxycodone Hcl, Sulfa Drugs Cross Reactors           Current Medications (03/15/2018):  This is the current hospital active medication list Current Facility-Administered Medications  Medication Dose Route Frequency Provider Last Rate Last Dose  . acetaminophen (TYLENOL) tablet 650 mg  650 mg Oral Q6H PRN Purohit, Shrey C, MD   650 mg at 03/13/18 2255   Or  . acetaminophen (TYLENOL) suppository 650 mg  650 mg Rectal Q6H PRN Purohit, Shrey C, MD      . azelastine (ASTELIN) 0.1 % nasal spray 1 spray  1 spray Each Nare BID Purohit, Shrey C, MD   1 spray at 03/15/18 1050  . bumetanide (BUMEX) tablet 2 mg  2 mg Oral Daily Sheikh, Kateri [place], DO   2 mg at 03/15/18 1052  . docusate sodium (COLACE) capsule 100 mg  100 mg Oral BID Purohit, Shrey C, MD   100 mg at 03/15/18 1052  . enoxaparin (LOVENOX) injection 30 mg  30 mg Subcutaneous Q24H Purohit, Shrey C, MD   30 mg at 03/15/18 1051  . fluticasone (FLONASE) 50 MCG/ACT nasal spray 1 spray  1 spray Each Nare Daily Purohit, Shrey C, MD   1 spray at 03/15/18 1050  . folic acid (FOLVITE) tablet 1 mg  1 mg Oral Daily Marguerita Merles [place], DO   1 mg at 03/15/18 1052  . ipratropium (ATROVENT) nebulizer solution 0.5 mg  0.5 mg Nebulization QID Sheikh, Kateri Mc Latif, DO   0.5 mg at 03/15/18 1506  . levalbuterol (XOPENEX) nebulizer solution 0.63 mg  0.63 mg Nebulization QID Sheikh, Omair Latif, DO   0.63 mg at 03/15/18 1507  . lidocaine (LIDODERM) 5 % 1 patch  1 patch Transdermal Daily Blount, Xenia T, NP   1 patch at 03/15/18  1051  . LORazepam (ATIVAN) injection 0-4 mg  0-4 mg Intravenous Q12H Purohit, Salli Quarry, MD       Or  . LORazepam (ATIVAN) tablet 0-4 mg  0-4 mg Oral Q12H Purohit, Shrey C, MD   1 mg at 03/15/18 1500  . metoprolol tartrate (LOPRESSOR) tablet 12.5 mg  12.5 mg Oral BID Purohit, Shrey C, MD   12.5 mg at 03/15/18 1052  . multivitamin with minerals tablet 1 tablet  1 tablet Oral Daily Marguerita Merles [place], DO   1 tablet at 03/15/18 1052  . nutrition supplement (JUVEN) (JUVEN) powder packet 1 packet  1 packet Oral BID BM Marguerita Merles [place], DO   1 packet at 03/15/18 1051  . ondansetron (ZOFRAN) tablet 4 mg  4 mg Oral Q6H PRN Purohit, Shrey C, MD       Or  . ondansetron (ZOFRAN) injection 4 mg  4 mg Intravenous Q6H PRN Purohit, Shrey C, MD      . piperacillin-tazobactam (ZOSYN) IVPB 3.375 g  3.375 g Intravenous Q8H Aleda Grana, RPH 12.5 mL/hr at 03/15/18 1100 3.375 g  at 03/15/18 1100  . polyethylene glycol (MIRALAX / GLYCOLAX) packet 17 g  17 g Oral Daily PRN Purohit, Shrey C, MD      . pyridOXINE (VITAMIN B-6) tablet 100 mg  100 mg Oral Daily Purohit, Shrey C, MD   100 mg at 03/15/18 1052  . RESOURCE THICKENUP CLEAR   Oral PRN Marguerita Merles Latif, DO      . thiamine (VITAMIN B-1) tablet 100 mg  100 mg Oral Daily Marguerita Merles [place], DO   100 mg at 03/15/18 1052  . timolol (TIMOPTIC) 0.25 % ophthalmic solution 1 drop  1 drop Left Eye BID Marguerita Merles [place], DO   1 drop at 03/15/18 1050  . traMADol (ULTRAM) tablet 25 mg  25 mg Oral Q6H PRN Purohit, Shrey C, MD   25 mg at 03/15/18 1113  . vitamin B-12 (CYANOCOBALAMIN) tablet 1,000 mcg  1,000 mcg Oral Daily Purohit, Shrey C, MD   1,000 mcg at 03/15/18 1052  . Vitamin D (Ergocalciferol) (DRISDOL) capsule 50,000 Units  50,000 Units Oral Q7 days Purohit, Salli Quarry, MD         Discharge Medications: Please see discharge summary for a list of discharge medications.  Relevant Imaging Results:  Relevant Lab Results:   Additional  Information ssn:[id number]  Coralyn Helling, LCSW

## 2018-03-15 NOTE — Progress Notes (Signed)
PROGRESS NOTE    Wendy Patterson  ZOX:[id number] DOB: Jul 05, 1931 DOA: 03/11/2018 PCP: Kaleen Mask, MD   Brief Narrative: Wendy Patterson is a 84 y.o. female with medical history significant of multiple allergies, chronic diastolic heart failure, paroxysmal atrial fibrillation who comes into the emergency department after a fall and found to have AKI, elevated lactate and elevated white blood cell count concerning for sepsis.  Patient is a very poor historian.  She reports that she fell approximately yesterday with a mechanical fall while walking between rooms.  At home she had to use a cane or walker intermittently.  She lives alone except for a hairdresser who comes to check in on her.  She denies any chest pain, palpitations, syncope/presyncope during this time.  She reports falling and having severe pain everywhere it was particularly worse in her neck where she has chronic pain from cervical osteoarthritis.  She denies any loss of continence of stool or bladder.      In the ED patient's vitals were notable for mild tachypnea which resolved.  Patient received empiric antibiotics fluids.  Of note patient's ethanol level was noted to be 14.  While being evaluated patient had a brief a symptomatic run of A. fib with RVR with heart rate in the 170s. Admitted for a Fall and Concern for EtOH Withdrawal.  Assessment & Plan:   Principal Problem:   AKI (acute kidney injury) (HCC) Active Problems:   Allergic rhinitis   Afib (HCC)   GERD (gastroesophageal reflux disease)   Fall at home, initial encounter   Sepsis [facility])   Atrial fibrillation with rapid ventricular response (HCC)   Protein-calorie malnutrition, severe   Elevated lipase   Fall   Leukocytosis   SIRS (systemic inflammatory response syndrome) (HCC)  Acute Fall -Unclear if this fall is mechanical in nature not particularly with her brief run of atrial fibrillation with RVR.  Certainly a contributing factor is her surreptitious  alcohol use for which she denies significant use. -Fortunately at this time she does not have any clear signs or symptoms sequelae from the fall other than contusions. -Physical Therapy consult recommending SNF -Social Work Consulted for assistance with placement  -Patient adamantly Refusing SNF Placement and may not even agree to Home with Home Health   AKI on CKD Stage 3 -Upon Review of Clinic Records patient has baseline CKD Stage 3 and GFR was 57 -In the setting of diminished p.o. intake and significant amounts of alcohol intake.   -She has been taking Bumetanide for her chronic diastolic heart failure and she reports taking this up to 5 times a day before too much. -Continued to Hold diuretics initially but will now resume Home Dose -C/w Gentle IV fluids at 75 mL's an hour x1 day and will changed to Bicarbonate drip given metabolic Acidosis but will now stop -Avoid Nephrotoxic Medications if possible  -Foley Catheter Placed -BUN/Cr went from 51/1.63 -> 44/1.72 -> 38/1.38 -> 33/1.18 -> 25/1.09 -Continue to Monitor and repeat CMP in AM  Acute Urinary Retention -Bladder Scan Showed 819 mL of Urine -Foley Catheter placed -TOV and foley removal today   Alcohol Use/Abuse with Concern for Withdrawl -Unclear precisely how much she is drinking however suspect is more than she is letting on.   -She does have an elevated MCV suggesting at least some degree of alcoholism  -AST was slightly elevated and went from 45 -> 66 -> 32 -> 25 -CIWA with IV Lorazepam per protocol -Continue with Multivitamin,  Folic acid and Thiamine  Elevated MCV -Patient is on B12 supplementation however suspect this is also related to alcohol ingestion. -Continue B12 supplementation with 1000 mcg po Daily   SIRS likley from Aspiration PNA -At this time there is no clear source for infection however patient may have aspirated and suspecting Aspiration PNA. WBC went from 27.6 -> 17.3 -> 20.3 -> 15.4 ->  14.8 -Procalcitonin went from 0.17 -> 0.23 -> 0.28 -She has not had any fevers or any other localizing signs or symptoms.   -Chest x-ray showed no acute pulmonary disease; Repeat CXR 03/14/18 showed Atelectasis versus consolidation LEFT lower lobe. Patchy BILATERAL perihilar infiltrates which could represent edema or infection. Small RIGHT pleural effusion (Likely this is from overhydration) -Repeat CXR this AM showed: Stable perihilar infiltrates and left lower lobe changes. -Urinalysis showed small hemoglobin, trace leukocytes, negative nitrites, many bacteria, and 0-5 WBCs not indicative of a urinary tract infection -LA was 3.04 and trended down to 2.58 -She has received empiric antibiotics including the emergency department. -Follow-up blood cultures from 03/11/2018 showed NGTD at 4 days -Because patient became hypoxic and likely aspirated will start IV Zosyn   Paroxysmal Atrial Fibrillation with RVR:  -Patient apparently has a known history of paroxysmal atrial fibrillation however she opted not to pursue anticoagulation and was just on aspirin which was subsequently removed due to GI upset. -Echocardiogram showed EF of 55 to 60% with grade 1 diastolic dysfunction and essentially no change since 2017 -C/w Metoprolol tartrate 12.5 mg twice daily -We will readdress anticoagulation possibly with ASA but due to recent falls will hold off on NOAC or Coumadin for now   Acute Respiratory Failure with Hypoxia likely from Aspiration and possible overhydration  -Desaturated to79-86% this AM and had to be placed on O2 via [place] -Likely from mild Overhydration and Pulmonary Edema -C/w Supplemental O2 via Anita as Necessary and Wean as Tolerated -Stop IVF and give IV Lasix x1 and Resume Home Diuretics -Continuous Pulse Oximetry and Maintain O2 Saturations >92% -Added Flutter Valve, Incentive Spirometry -It appears that patient has been coughing when trying to eat; SLP evaluated and placed patient on  Dysphagia 3 Nectar Thick Liquids  -Add Xopenex/Atrovent Scheduled  -Repeat CXR showed stable Patchy perihilar infiltrates are again identified and roughly stable given some technical variations in the imaging. Left lower lobe atelectasis is again noted. No pneumothorax is seen. No sizable pleural effusion is noted. -Started IV Zosyn and will re-evaluate respiratory status in AM -Wean O2 As tolerated   Acute on Chronic Diastolic Heart Failure -Slightly Volume Overloaded yesterday likely from overhydration but seems improved  -Noted on Echo on 07/15/2016 -Repeat echocardiogram this admission showed EF of 55 to 60% with grade 1 diastolic dysfunction -Continued to Hold diuretics given gentle IV fluid hydration with normal saline at 75 mL/hr and now changed to sodium bicarbonate drip at 150 mEq with a rate of 75 cc/hr and reduced to 50 mL/hr and now stopped given CXR findings -CXR showed Atelectasis versus consolidation LEFT lower lobe. Patchy BILATERAL perihilar infiltrates which could represent edema or infection.Small RIGHT pleural effusion. -Give IV Lasix 20 mg x1 -She is +2.880 liters so far -Resume Home Bumetanide 2 mg po Daily today  -Continue to Monitor Strict I's/O's  Chronic Neck Pain -Due to Osteoarthritis of the cervical spine. -C/w Acetaminophen 650 mg p.o. every 6 hours as needed for mild pain -Continue with Tramadol 25 mg p.o. every 6 as needed moderate pain -Discontinue NSAIDs  Severe protein calorie  malnutrition in the context of chronic illness -Nutritionist consulted and appreciate recommendations -Continue with Juven twice daily and encourage p.o. intake  Macrocytic Anemia -Patient's Hb/Hct went from 11.4/33.2 -> 9.2/26.5 -> 10.7/31.6 -> 9.3/27.3 -> 10.5/31.0 -Anemia Panel showed iron level of 129, TIBC of 88, TIBC of 217, saturation ratio of 59%, Ferritin level of 367, Folate level of 19.3, and Vitamin B12 level of 1072 -Continue to Monitor for S/Sx of  Bleeding -Repeat CBC in AM  Thrombocytopenia -Patient's Platelet Count went from 187 -> 138 -> 139 -> 125 -Continue to Monitor and Repeat CBC in AM  Elevated Lipase -Mildly Elevated at 154 and improved to 32 -Likely from Dehydration -Repeat in AM   Hypomagnesemia -Patient's Magnesium level was 1.6 -Replete with IV mag sulfate 2 g  -Continue to monitor and replete as necessary -Repeat magnesium level in a.m.  Hypophosphatemia -Patient's Phos Level was 1.9 -Replete with K Phos Neutral 500 mg x1 -Continue to Monitor and Replete as Necessary -Repeat Phos Level in AM   Non-Anion Gap Metabolic Acidosis -CO2 was 14; Anion Gap was 13 but now improved to 12 and CO2 is 24 -Started Bicarbonate gtt but will stop 2/2 pulmonary edema yesterday  -Continue to Monitor and Repeat CMP in AM   Hyponatremia -Patient's sodium is now improving and went from 132 -> 134 -> 138 -Continue with Sodium bicarbonate drip above -Repeat CMP in a.m.  Abnormal AST -Patient's AST went from 45 and increased to 66 -> 25 -Suspected from Alcoholism -Continue to monitor trend and if continues to worsen we will obtain an acute hepatitis panel and a right upper quadrant liver ultrasound -Repeat CMP in a.m.  DVT prophylaxis: Enoxaparin 30 mg sq q24h Code Status: FULL CODE Family Communication: Discussed with Daughter at bedside Disposition Plan: Remain Inpatient for continued Workup and Treatment  Consultants:   None   Procedures: ECHOCARDIOGRAM ------------------------------------------------------------------- Study Conclusions  - Left ventricle: The cavity size was normal. Wall thickness was   normal. Systolic function was normal. The estimated ejection   fraction was in the range of 55% to 60%. Although no diagnostic   regional wall motion abnormality was identified, this possibility   cannot be completely excluded on the basis of this study. Doppler   parameters are consistent with abnormal  left ventricular   relaxation (grade 1 diastolic dysfunction). The E/e&' ratio is   between 8-15, suggesting indeterminate LV filling pressure. - Aortic valve: Trileaflet; mildly thickened leaflets. There was   trivial regurgitation. - Mitral valve: Mildly thickened leaflets . There was mild   regurgitation. - Left atrium: The atrium was normal in size. - Tricuspid valve: There was moderate regurgitation. - Pulmonary arteries: PA peak pressure: 41 mm Hg (S). - Inferior vena cava: The vessel was normal in size. The   respirophasic diameter changes were in the normal range (>= 50%),   consistent with normal central venous pressure.  Impressions:  - Compared to a prior study in 2017, there are no significant   changes.  Antimicrobials:  Anti-infectives (From admission, onward)   Start     Dose/Rate Route Frequency Ordered Stop   03/15/18 1000  piperacillin-tazobactam (ZOSYN) IVPB 3.375 g     3.375 g 12.5 mL/hr over 240 Minutes Intravenous Every 8 hours 03/15/18 0851     03/11/18 1945  piperacillin-tazobactam (ZOSYN) IVPB 3.375 g     3.375 g 100 mL/hr over 30 Minutes Intravenous  Once 03/11/18 1942 03/11/18 2128   03/11/18 1945  vancomycin (VANCOCIN) IVPB 1000  mg/200 mL premix     1,000 mg 200 mL/hr over 60 Minutes Intravenous  Once 03/11/18 1942 03/11/18 2355     Subjective: Seen and examined at bedside stated that she is feeling better.  Still adamant about not going to skilled nursing facility.  No chest pain, shortness breath, nausea, vomiting.  We will try to remove Foley catheter today.  No other complaints or concerns at this time.  Objective: Vitals:   03/15/18 0527 03/15/18 1032 03/15/18 1051 03/15/18 1459  BP: 127/72  118/60 128/73  Pulse: 94  78 (!) 107  Resp: 10   20  Temp: 97.8 F (36.6 C)   97.9 F (36.6 C)  TempSrc: Oral   Oral  SpO2: 94% 93%  91%  Weight:      Height:        Intake/Output Summary (Last 24 hours) at 03/15/2018 1852 Last data filed at  03/15/2018 1359 Gross per 24 hour  Intake 715 ml  Output 675 ml  Net 40 ml   Filed Weights   03/11/18 1451  Weight: 45.4 kg (100 lb)   Examination: Physical Exam:  Constitutional: Thin and frail elderly chronically ill-appearing Caucasian female who is currently in no acute distress Eyes: There are anicteric.  Lids and conjunctive are normal ENMT: External ears and nose appear normal.  Grossly normal hearing Neck: Supple with no JVD Respiratory: Diminished to auscultation bilaterally with mild crackles but improved.  Has some diminished breath sounds bibasilarly and slight rhonchi.  Respiratory effort is normal however she is wearing supplemental oxygen via nasal cannula. Cardiovascular: Irregularly irregular but has a controlled rate.  Mild lower extremity edema Abdomen: Soft, nontender, nondistended.  Bowel sounds present all 4 quadrants GU: Deferred has a Foley catheter in place Musculoskeletal: No contractures or cyanosis. Skin: Warm and dry.  Has bilateral upper extremity and right lower extremity ecchymosis and some bruising Neurologic: Cranial nerves II through XII grossly intact no appreciable focal deficits tremors are slight. Psychiatric: Normal mood and affect.  Is awake and alert not confused.  Data Reviewed: I have personally reviewed following labs and imaging studies  CBC: Recent Labs  Lab 03/11/18 1522 03/12/18 0521 03/13/18 0947 03/14/18 0742 03/15/18 0609  WBC 27.6* 17.3* 20.3* 15.4* 14.8*  NEUTROABS 25.1*  --  18.7* 13.9* 12.8*  HGB 11.4* 9.2* 10.7* 9.3* 10.5*  HCT 33.2* 26.5* 31.6* 27.3* 31.0*  MCV 104.7* 104.3* 106.0* 104.6* 104.7*  PLT 187 138* 139* 125* 108*   Basic Metabolic Panel: Recent Labs  Lab 03/11/18 1522 03/12/18 0521 03/13/18 0947 03/14/18 0538 03/15/18 0609  NA 138 132* 134* 134* 138  K 4.3 3.9 4.2 3.1* 3.7  CL 105 103 107 102 102  CO2 17* 16* 14* 20* 24  GLUCOSE 104* 145* 83 186* 125*  BUN 51* 44* 38* 33* 25*  CREATININE 1.63*  1.72* 1.38* 1.18* 1.09*  CALCIUM 8.9 7.8* 8.2* 8.3* 8.4*  MG  --   --  1.2* 2.2 1.6*  PHOS  --   --  3.5 2.6 1.9*   GFR: Estimated Creatinine Clearance: 27.5 mL/min (A) (by C-G formula based on SCr of 1.09 mg/dL (H)). Liver Function Tests: Recent Labs  Lab 03/11/18 1522 03/12/18 0521 03/13/18 0947 03/14/18 0538 03/15/18 0609  AST 54* 45* 66* 32 25  ALT ALKPHOS 75 58 71 65 66  BILITOT 1.2 0.9 1.1 0.9 0.7  PROT 6.6 5.3* 6.1* 5.5* 5.5*  ALBUMIN 3.2* 2.6* 2.7* 2.5* 2.4*  Recent Labs  Lab 03/11/18 1522 03/12/18 0521 03/13/18 1609  LIPASE 160* 154* 32   No results for input(s): AMMONIA in the last 168 hours. Coagulation Profile: No results for input(s): INR, PROTIME in the last 168 hours. Cardiac Enzymes: Recent Labs  Lab 03/11/18 1522  CKTOTAL 196   BNP (last 3 results) No results for input(s): PROBNP in the last 8760 hours. HbA1C: No results for input(s): HGBA1C in the last 72 hours. CBG: No results for input(s): GLUCAP in the last 168 hours. Lipid Profile: No results for input(s): CHOL, HDL, LDLCALC, TRIG, CHOLHDL, LDLDIRECT in the last 72 hours. Thyroid Function Tests: No results for input(s): TSH, T4TOTAL, FREET4, T3FREE, THYROIDAB in the last 72 hours. Anemia Panel: No results for input(s): VITAMINB12, FOLATE, FERRITIN, TIBC, IRON, RETICCTPCT in the last 72 hours. Sepsis Labs: Recent Labs  Lab 03/11/18 1939 03/11/18 2337 03/12/18 0053 03/12/18 0521 03/13/18 0510  PROCALCITON  --   --  0.17 0.23 0.28  LATICACIDVEN 3.04* 2.58*  --   --   --     Recent Results (from the past 240 hour(s))  Blood culture (routine x 2)     Status: None (Preliminary result)   Collection Time: 03/11/18  4:34 PM  Result Value Ref Range Status   Specimen Description   Final    BLOOD BLOOD RIGHT FOREARM Performed at [facility], [address] [place], [place] [address]    Special Requests   Final    BOTTLES DRAWN AEROBIC ONLY Blood  Culture adequate volume Performed at [facility], [address] [place], [place] [address]    Culture   Final    NO GROWTH 4 DAYS Performed at [facility], [address] [place], [place] [address]    Report Status PENDING  Incomplete  Blood culture (routine x 2)     Status: None (Preliminary result)   Collection Time: 03/11/18  7:30 PM  Result Value Ref Range Status   Specimen Description   Final    BLOOD LEFT FOREARM Performed at [facility], [address], [place], [place] [address]    Special Requests   Final    BOTTLES DRAWN AEROBIC ONLY Blood Culture adequate volume Performed at [facility], [address] [place], [place] [address]    Culture   Final    NO GROWTH 4 DAYS Performed at [facility], [address], [place], [place] [address]    Report Status PENDING  Incomplete    Radiology Studies: Dg Chest Port 1 View  Result Date: 03/15/2018 CLINICAL DATA:  Shortness of breath EXAM: PORTABLE CHEST 1 VIEW COMPARISON:  03/14/2018 FINDINGS: Cardiac shadow is stable. Patchy perihilar infiltrates are again identified and roughly stable given some technical variations in the imaging. Left lower lobe atelectasis is again noted. No pneumothorax is seen. No sizable pleural effusion is noted. IMPRESSION: Stable perihilar infiltrates and left lower lobe changes. Electronically Signed   By: Alcide Clever M.D.   On: 03/15/2018 07:04   Dg Chest Port 1 View  Result Date: 03/14/2018 CLINICAL DATA:  Shortness of breath, history atrial fibrillation, CHF, hypertension EXAM: PORTABLE CHEST 1 VIEW COMPARISON:  Portable exam 1047 hours compared to 03/11/2018 FINDINGS: Stable heart size mediastinal contours. BILATERAL perihilar infiltrates greater on RIGHT. Atelectasis versus consolidation LEFT lower lobe. Small RIGHT pleural effusion. No pneumothorax. Atherosclerotic calcification aorta. Bones unremarkable. IMPRESSION:  Atelectasis versus consolidation LEFT lower lobe. Patchy BILATERAL perihilar infiltrates  which could represent edema or infection. Small RIGHT pleural effusion. Electronically Signed   By: Ulyses Southward M.D.   On: 03/14/2018 11:25   Scheduled Meds: . azelastine  1 spray Each Nare BID  . bumetanide  2 mg Oral Daily  . docusate sodium  100 mg Oral BID  . enoxaparin (LOVENOX) injection  30 mg Subcutaneous Q24H  . fluticasone  1 spray Each Nare Daily  . folic acid  1 mg Oral Daily  . ipratropium  0.5 mg Nebulization QID  . levalbuterol  0.63 mg Nebulization QID  . lidocaine  1 patch Transdermal Daily  . LORazepam  0-4 mg Intravenous Q12H   Or  . LORazepam  0-4 mg Oral Q12H  . metoprolol tartrate  12.5 mg Oral BID  . multivitamin with minerals  1 tablet Oral Daily  . nutrition supplement (JUVEN)  1 packet Oral BID BM  . pyridOXINE  100 mg Oral Daily  . thiamine  100 mg Oral Daily  . timolol  1 drop Left Eye BID  . vitamin B-12  1,000 mcg Oral Daily  . Vitamin D (Ergocalciferol)  50,000 Units Oral Q7 days   Continuous Infusions: . piperacillin-tazobactam (ZOSYN)  IV 3.375 g (03/15/18 1100)    LOS: 4 days   Merlene Laughter, DO [facility] Pager [phone]  If 7PM-7AM, please contact night-coverage [email or web address] Password [facility] 03/15/2018, 6:52 PM

## 2018-03-15 NOTE — Clinical Social Work Note (Signed)
Clinical Social Work Assessment  Patient Details  Name: Wendy Patterson MRN: [id number] Date of Birth: 07-23-1931  Date of referral:  03/15/18               Reason for consult:  Facility Placement                Permission sought to share information with:  Case Manager, Customer service manager, Family Supports Permission granted to share information::  Yes, Verbal Permission Granted  Name::     Wendy Patterson::  SNF  Relationship::  Daughter  Contact Information:     Housing/Transportation Living arrangements for the past 2 months:  Single Family Home Source of Information:  Wendy Patterson Patient Interpreter Needed:  None Criminal Activity/Legal Involvement Pertinent to Current Situation/Hospitalization:  No - Comment as needed Significant Relationships:  Wendy Patterson Lives with:  Wendy Children Do you feel safe going back to the place where you live?  Yes Need for family participation in patient care:  Yes (Comment)  Care giving concerns: Daughter, Wendy Patterson reports patient lives alone. Daughter reports patient needs assistance with all ADLs and refuses to have aids in the home. According to Wendy Patterson, this is patient's 3rd fall.    Social Worker assessment / plan:  LCSW consulted for SNF placement.  Patient admitted for fall.  LCSW met at bedside with patient patient's daughter, Wendy Patterson present.   According to Wendy Patterson patient lives alone. Patient uses a cane and walker at baseline and is currently receiving Wendy Patterson PT from Wendy Patterson at home. Daughter reports that patient needs assistance with all ADL's. Wendy Patterson states that patient takes sponge baths and is barely able to dress herself. Daughter reports that she preps meals and buys tv dinners and snacks for patient as patient is not able to cook.   Patient is apprehensive to help in the home and SNF for rehab. Patient and family agreeable to fax out to SNF facilities. Patient's Patterson live in [place]. Patient would like to be closer to them.  Daughter Wendy Patterson would like for LCSW to fax patient to two facilities in [place] as well as local facilities. LCSW explained that transportation would need to be provided or paid for privately. Daughter agreed  PLAN: TBD, LCSW will fax patient to facilities. Patient is apprehensive to SNF at time of assessment.    Employment status:  Retired Nurse, Wendy PT Recommendations:  [place] / Referral to community resources:     Patient/Family's Response to care:  Family is proactive in patient's care. Patient and family thankful for visit.   Patient/Family's Understanding of and Emotional Response to Diagnosis, Current Treatment, and Prognosis:  Patient's family has a clear understanding of patient diagnosis and patient's needs. Patient is not accepting at this time. Family feels that patient needs a higher level of care. Patient wishes to remain independent.   Emotional Assessment Appearance:  Appears stated age Attitude/Demeanor/Rapport:    Affect (typically observed):    Orientation:  Oriented to Self, Oriented to Place Alcohol / Substance use:  Not Applicable Psych involvement (Current and /or in the community):  No (Comment)  Discharge Needs  Concerns to be addressed:  No discharge needs identified Readmission within the last 30 days:  No Current discharge risk:  None Barriers to Discharge:  Continued Medical Work up, No SNF bed   Wendy Snare, LCSW 03/15/2018, 3:31 PM

## 2018-03-16 ENCOUNTER — Inpatient Hospital Stay (HOSPITAL_COMMUNITY): Payer: Medicare Other

## 2018-03-16 LAB — COMPREHENSIVE METABOLIC PANEL
ALBUMIN: 2.1 g/dL — AB (ref 3.5–5.0)
ALT: 15 U/L (ref 14–54)
AST: 34 U/L (ref 15–41)
Alkaline Phosphatase: 61 U/L (ref 38–126)
Anion gap: 10 (ref 5–15)
BUN: 23 mg/dL — AB (ref 6–20)
CO2: 28 mmol/L (ref 22–32)
CREATININE: 1.1 mg/dL — AB (ref 0.44–1.00)
Calcium: 8.3 mg/dL — ABNORMAL LOW (ref 8.9–10.3)
Chloride: 101 mmol/L (ref 101–111)
GFR calc non Af Amer: 45 mL/min — ABNORMAL LOW (ref >60)
GFR, EST AFRICAN AMERICAN: 52 mL/min — AB (ref >60)
GLUCOSE: 104 mg/dL — AB (ref 65–99)
Potassium: 4.4 mmol/L (ref 3.5–5.1)
Sodium: 139 mmol/L (ref 135–145)
Total Bilirubin: 1.2 mg/dL (ref 0.3–1.2)
Total Protein: 5.3 g/dL — ABNORMAL LOW (ref 6.5–8.1)

## 2018-03-16 LAB — CBC WITH DIFFERENTIAL/PLATELET
BASOS ABS: 0 10*3/uL (ref 0.0–0.1)
Basophils Relative: 0 %
Eosinophils Absolute: 0.1 10*3/uL (ref 0.0–0.7)
Eosinophils Relative: 1 %
HCT: 24.9 % — ABNORMAL LOW (ref 36.0–46.0)
HEMOGLOBIN: 8.5 g/dL — AB (ref 12.0–15.0)
LYMPHS ABS: 1 10*3/uL (ref 0.7–4.0)
Lymphocytes Relative: 6 %
MCH: 36.3 pg — AB (ref 26.0–34.0)
MCHC: 34.1 g/dL (ref 30.0–36.0)
MCV: 106.4 fL — ABNORMAL HIGH (ref 78.0–100.0)
Monocytes Absolute: 1 10*3/uL (ref 0.1–1.0)
Monocytes Relative: 7 %
NEUTROS PCT: 86 %
Neutro Abs: 13 10*3/uL — ABNORMAL HIGH (ref 1.7–7.7)
PLATELETS: 129 10*3/uL — AB (ref 150–400)
RBC: 2.34 MIL/uL — AB (ref 3.87–5.11)
RDW: 13.6 % (ref 11.5–15.5)
WBC: 15.1 10*3/uL — AB (ref 4.0–10.5)

## 2018-03-16 LAB — CULTURE, BLOOD (ROUTINE X 2)
Culture: NO GROWTH
Culture: NO GROWTH
Special Requests: ADEQUATE
Special Requests: ADEQUATE

## 2018-03-16 LAB — PROCALCITONIN: Procalcitonin: 0.13 ng/mL

## 2018-03-16 LAB — MAGNESIUM: Magnesium: 1.8 mg/dL (ref 1.7–2.4)

## 2018-03-16 LAB — PHOSPHORUS: PHOSPHORUS: 3.1 mg/dL (ref 2.5–4.6)

## 2018-03-16 MED ORDER — METOPROLOL TARTRATE 25 MG PO TABS
25.0000 mg | ORAL_TABLET | Freq: Two times a day (BID) | ORAL | Status: DC
  Administered 2018-03-16 – 2018-03-18 (×5): 25 mg via ORAL
  Filled 2018-03-16 (×5): qty 1

## 2018-03-16 MED ORDER — LEVALBUTEROL HCL 0.63 MG/3ML IN NEBU
0.6300 mg | INHALATION_SOLUTION | Freq: Two times a day (BID) | RESPIRATORY_TRACT | Status: DC
  Administered 2018-03-16 – 2018-03-17 (×3): 0.63 mg via RESPIRATORY_TRACT
  Filled 2018-03-16 (×3): qty 3

## 2018-03-16 MED ORDER — IPRATROPIUM BROMIDE 0.02 % IN SOLN
0.5000 mg | Freq: Two times a day (BID) | RESPIRATORY_TRACT | Status: DC
Start: 2018-03-16 — End: 2018-03-18
  Administered 2018-03-16 – 2018-03-17 (×3): 0.5 mg via RESPIRATORY_TRACT
  Filled 2018-03-16 (×3): qty 2.5

## 2018-03-16 NOTE — Care Management Important Message (Signed)
Important Message  Patient Details  Name: Wendy Patterson MRN: [id number] Date of Birth: 1931/08/27   Medicare Important Message Given:  Yes    Caren Macadam 03/16/2018, 11:44 AMImportant Message  Patient Details  Name: Wendy Patterson MRN: [id number] Date of Birth: 1931-02-17   Medicare Important Message Given:  Yes    Caren Macadam 03/16/2018, 11:44 AM

## 2018-03-16 NOTE — Progress Notes (Signed)
PROGRESS NOTE    Wendy Patterson  AVW:[id number] DOB: [birth year] DOA: 03/11/2018 PCP: Kaleen Mask, MD   Brief Narrative: Wendy Patterson is a 84 y.o. female with medical history significant of multiple allergies, chronic diastolic heart failure, paroxysmal atrial fibrillation who comes into the emergency department after a fall and found to have AKI, elevated lactate and elevated white blood cell count concerning for sepsis.  Patient is a very poor historian.  She reports that she fell approximately yesterday with a mechanical fall while walking between rooms.  At home she had to use a cane or walker intermittently.  She lives alone except for a hairdresser who comes to check in on her.  She denies any chest pain, palpitations, syncope/presyncope during this time.  She reports falling and having severe pain everywhere it was particularly worse in her neck where she has chronic pain from cervical osteoarthritis.  She denies any loss of continence of stool or bladder.      In the ED patient's vitals were notable for mild tachypnea which resolved.  Patient received empiric antibiotics fluids.  Of note patient's ethanol level was noted to be 14.  While being evaluated patient had a brief a symptomatic run of A. fib with RVR with heart rate in the 170s. Admitted for a Fall and Concern for EtOH Withdrawal.  She improved and was out of alcohol withdrawals and has now agreed to skilled nursing facility after much coaxing from her family.  Family wants to take her to skilled nursing facility in [place] and discussed with social worker today.  She stated that it will take 2 days advance notice for transportation to take her to [facility] so cannot be discharged until Thursday 03/18/18.  Assessment & Plan:   Principal Problem:   AKI (acute kidney injury) (HCC) Active Problems:   Allergic rhinitis   Afib (HCC)   GERD (gastroesophageal reflux disease)   Fall at home, initial encounter   Sepsis [facility])  Atrial fibrillation with rapid ventricular response (HCC)   Protein-calorie malnutrition, severe   Elevated lipase   Fall   Leukocytosis   SIRS (systemic inflammatory response syndrome) (HCC)  Acute Fall -Unclear if this fall is mechanical in nature not particularly with her brief run of atrial fibrillation with RVR.  Certainly a contributing factor is her surreptitious alcohol use for which she denies significant use. -Fortunately at this time she does not have any clear signs or symptoms sequelae from the fall other than contusions. -Physical Therapy consult recommending SNF -Social Work Consulted for assistance with placement  -Patient adamantly was Refusing SNF Placement but after realizing how weak she was on PT evaluation yesterday she is agreeable to SNF. -Patient has a bed offer in [place] however per Child psychotherapist and will take 2 days prior to set up transportation ([facility]) and likely cannot be discharged until Thursday as they require 48 hours advance notice. Patient is medically stable to be discharged to skilled nursing facility at [facility] at this time.  AKI on CKD Stage 3 -Upon Review of Clinic Records patient has baseline CKD Stage 3 and GFR was 57 -In the setting of diminished p.o. intake and significant amounts of alcohol intake.   -She has been taking Bumetanide for her chronic diastolic heart failure and she reports taking this up to 5 times a day before too much. -Continued to Hold diuretics initially but will now resume Home Dose -C/w Gentle IV fluids at 75 mL's an hour x1 day and will  changed to Bicarbonate drip given metabolic Acidosis but will now stop -Avoid Nephrotoxic Medications if possible  -Foley Catheter Placed and a trial of void was done however patient was unable to void independently so Foley catheter had to be replaced -BUN/Cr stable at 23/1.10 -Continue to Monitor and repeat CMP in AM  Acute Urinary Retention -Bladder Scan Showed 819 mL of  Urine -Foley Catheter placed -TOV and foley removal and patient was unable to void overnight.  Had a in and out catheter however continued to retain urine so a Foley catheter was reinserted -Patient will need to follow-up with urology as an outpatient  Alcohol Use/Abuse with Concern for Withdrawl -Unclear precisely how much she is drinking however suspect is more than she is letting on.   -She does have an elevated MCV suggesting at least some degree of alcoholism  -AST was slightly elevated but now improved at 34 -CIWA with IV Lorazepam per protocol -Continue with Multivitamin, Folic acid and Thiamine  Elevated MCV -Patient is on B12 supplementation however suspect this is also related to alcohol ingestion. -Continue B12 supplementation with 1000 mcg po Daily   SIRS likley from Aspiration PNA -At this time there is no clear source for infection however patient may have aspirated and suspecting Aspiration PNA. WBC went from 27.6 -> 17.3 -> 20.3 -> 15.4 -> 14.8 -> 15.1 -Procalcitonin went from 0.17 -> 0.23 -> 0.28 -> 0.13 -She has not had any fevers or any other localizing signs or symptoms.   -Repeat CXR this AM showed: Fairly stable appearance of the chest. Bilateral perihilar interstitial infiltrates. Bibasilar densities as described. -Urinalysis showed small hemoglobin, trace leukocytes, negative nitrites, many bacteria, and 0-5 WBCs not indicative of a urinary tract infection -LA was 3.04 and trended down to 2.58 -She has received empiric antibiotics including the emergency department. -Follow-up blood cultures from 03/11/2018 showed NGTD at 5 days -Because patient became hypoxic and likely aspirated she was started on IV Zosyn and will change to Augmentin at discharge -Repeat SLP evaluation showed the patient had dysphagia 2 diet with thin liquids  Paroxysmal Atrial Fibrillation with RVR:  -Patient apparently has a known history of paroxysmal atrial fibrillation however she opted  not to pursue anticoagulation and was just on aspirin which was subsequently removed due to GI upset. -Echocardiogram showed EF of 55 to 60% with grade 1 diastolic dysfunction and essentially no change since 2017 -Increase metoprolol tartrate 12.5 mg twice daily to 25 mg twice daily -We will readdress anticoagulation possibly with ASA but due to recent falls will hold off on NOAC or Coumadin for now   Acute Respiratory Failure with Hypoxia likely from Aspiration and possible overhydration  -Desaturated to 79-86% throughout the hospitalization and had to be placed on O2 via [place] -Likely from mild Overhydration and Pulmonary Edema, and suspected Aspiration  -C/w Supplemental O2 via [place] as Necessary and Wean as Tolerated -Stop IVF and give IV Lasix x1 and Resume Home Diuretics -Continuous Pulse Oximetry and Maintain O2 Saturations >92% -Added Flutter Valve, Incentive Spirometry -It appears that patient has been coughing when trying to eat; SLP evaluated and placed patient on Dysphagia 3 Nectar Thick Liquids and now changed to Dysphagia 2 Thin Liquids  -Add Xopenex/Atrovent Scheduled  -Repeat CXR as above -Started IV Zosyn and will re-evaluate respiratory status in AM -Wean O2 As tolerated and will remove oxygen supplementation today.  Per nurse she was saturating 95% on room air  Acute on Chronic Diastolic Heart Failure -Slightly Volume Overloaded  yesterday likely from overhydration but seems improved  -Noted on Echo on 07/15/2016 -Repeat Echocardiogram this admission showed EF of 55 to 60% with grade 1 diastolic dysfunction -Given IV Lasix 20 mg x1 hospitalization and Resumed Home Bumetanide 2 mg po Daily today  -Continue to Monitor Strict I's/O's -She is +2.200 liters so far  Chronic Neck Pain -Due to Osteoarthritis of the cervical spine. -C/w Acetaminophen 650 mg p.o. every 6 hours as needed for mild pain -Continue with Tramadol 25 mg p.o. every 6 as needed moderate pain -Discontinue  NSAIDs  Severe protein calorie malnutrition in the context of chronic illness -Nutritionist consulted and appreciate recommendations -Continue with [place] twice daily and encourage p.o. Intake -Diet is now dysphagia to find child with thin liquids  Macrocytic Anemia -Patient's Hb/Hct trended down from 10.5/31.0 -> 8.5/24.9 -Anemia Panel showed iron level of 129, TIBC of 88, TIBC of 217, saturation ratio of 59%, Ferritin level of 367, Folate level of 19.3, and Vitamin B12 level of 1072 -Continue to Monitor for S/Sx of Bleeding -Repeat CBC in AM  Thrombocytopenia -Patient's Platelet Count went from 187 -> 129 -Continue to Monitor and Repeat CBC in AM  Elevated Lipase -Mildly Elevated at 154 and improved to 32 -Likely from Dehydration -Repeat in AM   Hypomagnesemia -Patient's Magnesium level was 1.6 and improved to 1.8 -Replete with IV mag sulfate 2 g yesterday -Continue to monitor and replete as necessary -Repeat magnesium level in a.m.  Hypophosphatemia -Patient's Phos Level was 1.9 and improved to 3.1 -Replete with K Phos Neutral 500 mg x1 yesterday -Continue to Monitor and Replete as Necessary -Repeat Phos Level in AM   Non-Anion Gap Metabolic Acidosis -CO2 was 14; Anion Gap was 13 but now improved to 10 and CO2 is 28 -Started Bicarbonate gtt but will stop 2/2 pulmonary edema yesterday  -Continue to Monitor and Repeat CMP in AM   Hyponatremia -Patient's sodium is now improving and went from 132 -> 139 -Continue with Sodium bicarbonate drip above -Repeat CMP in a.m.  Abnormal AST -Patient's AST went from 45 and increased to 66 -> 34 -Suspected from Alcoholism -Continue to monitor trend and if continues to worsen we will obtain an acute hepatitis panel and a right upper quadrant liver ultrasound -Repeat CMP in a.m.  DVT prophylaxis: Enoxaparin 30 mg sq q24h Code Status: FULL CODE Family Communication: Discussed with Daughter at bedside Disposition Plan: Remain  Inpatient for continued Workup and Treatment; patient finally agreeable to SNF and will be going to a skilled nursing facility in [place] however per my discussion with the social worker Bernette the patient not be discharged until 2 days given that the transportation ([facility]) needs to be notified at 48 hours in advance.  Patient likely to be discharged on Thursday 03/18/18.  She is currently medically stable to be discharged at this time  Consultants:   None   Procedures: ECHOCARDIOGRAM ------------------------------------------------------------------- Study Conclusions  - Left ventricle: The cavity size was normal. Wall thickness was   normal. Systolic function was normal. The estimated ejection   fraction was in the range of 55% to 60%. Although no diagnostic   regional wall motion abnormality was identified, this possibility   cannot be completely excluded on the basis of this study. Doppler   parameters are consistent with abnormal left ventricular   relaxation (grade 1 diastolic dysfunction). The E/e&' ratio is   between 8-15, suggesting indeterminate LV filling pressure. - Aortic valve: Trileaflet; mildly thickened leaflets. There was   trivial  regurgitation. - Mitral valve: Mildly thickened leaflets . There was mild   regurgitation. - Left atrium: The atrium was normal in size. - Tricuspid valve: There was moderate regurgitation. - Pulmonary arteries: PA peak pressure: 41 mm Hg (S). - Inferior vena cava: The vessel was normal in size. The   respirophasic diameter changes were in the normal range (>= 50%),   consistent with normal central venous pressure.  Impressions:  - Compared to a prior study in 2017, there are no significant   changes.  Antimicrobials:  Anti-infectives (From admission, onward)   Start     Dose/Rate Route Frequency Ordered Stop   03/15/18 1000  piperacillin-tazobactam (ZOSYN) IVPB 3.375 g     3.375 g 12.5 mL/hr over 240 Minutes Intravenous  Every 8 hours 03/15/18 0851     03/11/18 1945  piperacillin-tazobactam (ZOSYN) IVPB 3.375 g     3.375 g 100 mL/hr over 30 Minutes Intravenous  Once 03/11/18 1942 03/11/18 2128   03/11/18 1945  vancomycin (VANCOCIN) IVPB 1000 mg/200 mL premix     1,000 mg 200 mL/hr over 60 Minutes Intravenous  Once 03/11/18 1942 03/11/18 2355     Subjective: Seen and examined at bedside and stated she realized how weak she was.  She had some tremors today that were more pronounced.  No chest pain, shortness breath, nausea, vomiting.  Difficult time urinating.  No other concerns or complaints at this time  Objective: Vitals:   03/16/18 0953 03/16/18 1000 03/16/18 1246 03/16/18 1957  BP:   122/67   Pulse:   94   Resp:   18   Temp:   98.5 F (36.9 C)   TempSrc:   Oral   SpO2: 99% 95% 90% 91%  Weight:      Height:        Intake/Output Summary (Last 24 hours) at 03/16/2018 2122 Last data filed at 03/16/2018 1500 Gross per 24 hour  Intake 220 ml  Output 900 ml  Net -680 ml   Filed Weights   03/11/18 1451  Weight: 45.4 kg (100 lb)   Examination: Physical Exam:  Constitutional: Thin and frail elderly chronically ill-appearing Caucasian female no acute distress appears calm Eyes: Sclera are anicteric.  Lids and conjunctive are normal ENMT: External ears and nose appear normal; mostly normal hearing Neck: Supple with no JVD  Respiratory: Diminished to auscultation bilaterally with improved crackles.  Did have some diminished breath sounds but no appreciable rhonchi at this time.  Respiratory effort is normal however she still wearing supplemental oxygen via nasal cannula and have asked the nurse to remove it. Cardiovascular: Irregularly irregular.  Mild lower extremity edema Abdomen: Soft, nontender, nondistended.  Bowel sounds present all 4 quadrants GU: Deferred.  Foley catheter had been removed Musculoskeletal: No contractures or cyanosis Skin: Warm and dry.  Has bilateral upper extremity and  lower right extremity ecchymosis and some bruising Neurologic: Cranial nerves II through XII grossly intact with no appreciable focal deficits.  She does have tremors that are more pronounced today bilaterally in the upper extremities Psychiatric: Normal mood and affect.  Is awake and alert and oriented x3.  Patient is not confused.  Intact judgment and insight  Data Reviewed: I have personally reviewed following labs and imaging studies  CBC: Recent Labs  Lab 03/11/18 1522 03/12/18 0521 03/13/18 0947 03/14/18 0742 03/15/18 0609 03/16/18 0525  WBC 27.6* 17.3* 20.3* 15.4* 14.8* 15.1*  NEUTROABS 25.1*  --  18.7* 13.9* 12.8* 13.0*  HGB 11.4* 9.2* 10.7*  9.3* 10.5* 8.5*  HCT 33.2* 26.5* 31.6* 27.3* 31.0* 24.9*  MCV 104.7* 104.3* 106.0* 104.6* 104.7* 106.4*  PLT 187 138* 139* 125* 108* 129*   Basic Metabolic Panel: Recent Labs  Lab 03/12/18 0521 03/13/18 0947 03/14/18 0538 03/15/18 0609 03/16/18 0525  NA 132* 134* 134* 138 139  K 3.9 4.2 3.1* 3.7 4.4  CL 103 107 102 102 101  CO2 16* 14* 20* 24 28  GLUCOSE 145* 83 186* 125* 104*  BUN 44* 38* 33* 25* 23*  CREATININE 1.72* 1.38* 1.18* 1.09* 1.10*  CALCIUM 7.8* 8.2* 8.3* 8.4* 8.3*  MG  --  1.2* 2.2 1.6* 1.8  PHOS  --  3.5 2.6 1.9* 3.1   GFR: Estimated Creatinine Clearance: 27.3 mL/min (A) (by C-G formula based on SCr of 1.1 mg/dL (H)). Liver Function Tests: Recent Labs  Lab 03/12/18 0521 03/13/18 0947 03/14/18 0538 03/15/18 0609 03/16/18 0525  AST 45* 66* 32 25 34  ALT ALKPHOS 58 71 65 66 61  BILITOT 0.9 1.1 0.9 0.7 1.2  PROT 5.3* 6.1* 5.5* 5.5* 5.3*  ALBUMIN 2.6* 2.7* 2.5* 2.4* 2.1*   Recent Labs  Lab 03/11/18 1522 03/12/18 0521 03/13/18 1609  LIPASE 160* 154* 32   No results for input(s): AMMONIA in the last 168 hours. Coagulation Profile: No results for input(s): INR, PROTIME in the last 168 hours. Cardiac Enzymes: Recent Labs  Lab 03/11/18 1522  CKTOTAL 196   BNP (last 3 results) No  results for input(s): PROBNP in the last 8760 hours. HbA1C: No results for input(s): HGBA1C in the last 72 hours. CBG: No results for input(s): GLUCAP in the last 168 hours. Lipid Profile: No results for input(s): CHOL, HDL, LDLCALC, TRIG, CHOLHDL, LDLDIRECT in the last 72 hours. Thyroid Function Tests: No results for input(s): TSH, T4TOTAL, FREET4, T3FREE, THYROIDAB in the last 72 hours. Anemia Panel: No results for input(s): VITAMINB12, FOLATE, FERRITIN, TIBC, IRON, RETICCTPCT in the last 72 hours. Sepsis Labs: Recent Labs  Lab 03/11/18 1939 03/11/18 2337 03/12/18 0053 03/12/18 0521 03/13/18 0510 03/16/18 0525  PROCALCITON  --   --  0.17 0.23 0.28 0.13  LATICACIDVEN 3.04* 2.58*  --   --   --   --     Recent Results (from the past 240 hour(s))  Blood culture (routine x 2)     Status: None   Collection Time: 03/11/18  4:34 PM  Result Value Ref Range Status   Specimen Description   Final    BLOOD BLOOD RIGHT FOREARM Performed at [facility], [address], [place], [place] [address]    Special Requests   Final    BOTTLES DRAWN AEROBIC ONLY Blood Culture adequate volume Performed at [facility], [address], [place], [place] [address]    Culture   Final    NO GROWTH 5 DAYS Performed at [facility], [address] [place], [place] [address]    Report Status 03/16/2018 FINAL  Final  Blood culture (routine x 2)     Status: None   Collection Time: 03/11/18  7:30 PM  Result Value Ref Range Status   Specimen Description   Final    BLOOD LEFT FOREARM Performed at [facility], [address], [place], [place] [address]    Special Requests   Final    BOTTLES DRAWN AEROBIC ONLY Blood Culture adequate volume Performed at [facility], [address] [place],  [place] [address]    Culture   Final    NO GROWTH 5 DAYS Performed at [facility], [address] [place], [place]  [address]    Report Status 03/16/2018 FINAL  Final    Radiology Studies: Dg Chest Port 1 View  Result Date: 03/16/2018 CLINICAL DATA:  Shortness of breath.  Possible aspiration. EXAM: PORTABLE CHEST 1 VIEW COMPARISON:  Chest x-ray of Mar 15, 2018 FINDINGS: The lungs are well-expanded. There are persistent increased perihilar densities bilaterally. The left hemidiaphragm is partially obscured. There is a small amount of fluid in the right lateral costophrenic angle. The heart is normal in size. The pulmonary vascularity is indistinct. IMPRESSION: Fairly stable appearance of the chest. Bilateral perihilar interstitial infiltrates. Bibasilar densities as described. Electronically Signed   By: David  [place] M.D.   On: 03/16/2018 10:07   Dg Chest Port 1 View  Result Date: 03/15/2018 CLINICAL DATA:  Shortness of breath EXAM: PORTABLE CHEST 1 VIEW COMPARISON:  03/14/2018 FINDINGS: Cardiac shadow is stable. Patchy perihilar infiltrates are again identified and roughly stable given some technical variations in the imaging. Left lower lobe atelectasis is again noted. No pneumothorax is seen. No sizable pleural effusion is noted. IMPRESSION: Stable perihilar infiltrates and left lower lobe changes. Electronically Signed   By: Alcide Clever M.D.   On: 03/15/2018 07:04   Scheduled Meds: . azelastine  1 spray Each Nare BID  . bumetanide  2 mg Oral Daily  . docusate sodium  100 mg Oral BID  . enoxaparin (LOVENOX) injection  30 mg Subcutaneous Q24H  . fluticasone  1 spray Each Nare Daily  . folic acid  1 mg Oral Daily  . ipratropium  0.5 mg Nebulization BID  . levalbuterol  0.63 mg Nebulization BID  . lidocaine  1 patch Transdermal Daily  . metoprolol tartrate  25 mg Oral BID  . multivitamin with minerals  1 tablet Oral Daily  . nutrition supplement (JUVEN)  1 packet Oral BID BM  . pyridOXINE  100 mg Oral Daily  . thiamine  100 mg Oral Daily  . timolol  1 drop Left Eye BID  . vitamin B-12  1,000 mcg Oral  Daily  . Vitamin D (Ergocalciferol)  50,000 Units Oral Q7 days   Continuous Infusions: . piperacillin-tazobactam (ZOSYN)  IV 3.375 g (03/16/18 1402)    LOS: 5 days   Merlene Laughter, DO [facility] Pager [phone]  If 7PM-7AM, please contact night-coverage [email or web address] Password [email or web address] 03/16/2018, 9:22 PM

## 2018-03-16 NOTE — Progress Notes (Signed)
Pt with frequent requests to use bedpan however only 1 successful occurrence. Bladder scan at this time reveals >600cc in bladder. NP on call notified. Awaiting orders. Will continue to monitor.

## 2018-03-16 NOTE — Progress Notes (Addendum)
  Speech Language Pathology Treatment: Dysphagia  Patient Details Name: Wendy Patterson MRN: [id number] DOB: 1931/08/15 Today's Date: 03/16/2018 Time: 1500-1530 SLP Time Calculation (min) (ACUTE ONLY): 30 min  Assessment / Plan / Recommendation Clinical Impression  Pt seen at bedside for assessment of diet tolerance and education. Pt's daughter was present, and had several questions regarding MBS results. SLP reviewed MBS results and recommendations with pt/daughter (no penetration or aspiration, but at risk with poor alertness, inadequate positioning. Rec adherence to posted precautions) . Pt was given trials of nectar thick liquid, and thin liquid via cup sip, both of which she tolerated without overt s/s aspiration. Recommend changing solids to Dys 3 for energy conservation, and advancing thin liquids given improvement in alertness and mentation. Safe swallow precautions were updated at [facility]. ST will continue to follow to assess tolerance. Recommend continued ST intervention at next level of care (daughter anticipates DC to facility in [place] tomorrow).   Also recommend OT consult, as pt's daughter asks if weighted utensils would facilitate self-feeding, given tremor.   HPI HPI: Pt is an 84 y.o. female with PMH of multiple allergies, chronic diastolic heart failure, paroxysmal atrial fibrillation who comes into the emergency department after a fall and found to have AKI, elevated lactate and elevated white blood cell count concerning for sepsis, concern for EtOH withdrawal. Patient is a very poor historian. She reports that she fell approximately yesterday with a mechanical fall while walking between rooms. Nutrition following- noted pt has tremor which affects ability to cut items well/ feeding difficulty. CXR negative for acute findings. Bedside swallow eval ordered.      SLP Plan  Continue with current plan of care;Consult other service (OT.)       Recommendations  Diet recommendations:  Thin liquid;Dysphagia 2 (fine chop) Liquids provided via: No straw;Cup Medication Administration: Whole meds with liquid(crush large pills) Supervision: Patient able to self feed;Staff to assist with self feeding Compensations: Slow rate;Small sips/bites;Follow solids with liquid;Minimize environmental distractions Postural Changes and/or Swallow Maneuvers: Seated upright 90 degrees;Upright 30-60 min after meal                General recommendations: OT consult(due to tremor - daughter wondering if weighted utensils would be beneficial) Oral Care Recommendations: Oral care BID Follow up Recommendations: Skilled Nursing facility SLP Visit Diagnosis: Dysphagia, oropharyngeal phase (R13.12) Plan: Continue with current plan of care;Consult other service (comment)(OT.)       GO              Celia B. Murvin [facility], CCC-SLP Speech Language Pathologist [phone]  Leigh Aurora 03/16/2018, 3:30 PM

## 2018-03-16 NOTE — Progress Notes (Addendum)
Physical Therapy Treatment Patient Details Name: Wendy Patterson MRN: [id number] DOB: 17-Aug-1931 Today's Date: 03/16/2018    History of Present Illness Wendy Patterson is a 84 y.o. female with medical history significant of multiple allergies, chronic diastolic heart failure, paroxysmal atrial fibrillation adm through ED after a fall and found to have AKI, elevated lactate and elevated white blood cell count concerning for sepsis.    PT Comments    Pt progressing slowly, extremely deconditioned although transfers improved from last session;  SpO2=84% on RA, dyspneic with activity, O2 replaced at 2L, HR 134 max; continue to recommend SNF, pt agreeable during PT eval to rehab; daughter present end of session;   Follow Up Recommendations  SNF     Equipment Recommendations  None recommended by PT    Recommendations for Other Services       Precautions / Restrictions Precautions Precautions: Fall Restrictions Weight Bearing Restrictions: No    Mobility  Bed Mobility Overal bed mobility: Needs Assistance Bed Mobility: Supine to Sit;Sit to Supine     Supine to sit: Mod assist Sit to supine: Max assist;+2 for safety/equipment;+2 for physical assistance   General bed mobility comments: cues to self assist, incr time, assist with trunk and LEs in both directions  Transfers Overall transfer level: Needs assistance Equipment used: 4-wheeled walker Transfers: Sit to/from Stand Sit to Stand: Mod assist;+2 physical assistance;+2 safety/equipment         General transfer comment: anterior-superior wt shift to stand, heavy posterior bias in standing, decr assist needed with second standing trial  Ambulation/Gait Ambulation/Gait assistance: Mod assist;+2 physical assistance;+2 safety/equipment   Assistive device: 4-wheeled walker       General Gait Details: lateral steps along EOB   Stairs             Wheelchair Mobility    Modified Rankin (Stroke Patients Only)        Balance     Sitting balance-Leahy Scale: Poor       Standing balance-Leahy Scale: Poor                              Cognition Arousal/Alertness: Awake/alert Behavior During Therapy: WFL for tasks assessed/performed Overall Cognitive Status: pt states the year is 23, and the month is August or September                                        Exercises      General Comments        Pertinent Vitals/Pain Pain Assessment: 0-10 Faces Pain Scale: Hurts little more Pain Location: neck Pain Descriptors / Indicators: Sore Pain Intervention(s): Monitored during session;Repositioned    Home Living                      Prior Function            PT Goals (current goals can now be found in the care plan section) Acute Rehab PT Goals Patient Stated Goal: discussed rehab, pt reluctantly agreeable; family agreeable PT Goal Formulation: With patient/family Time For Goal Achievement: 03/26/18 Potential to Achieve Goals: Good Progress towards PT goals: Progressing toward goals    Frequency    Min 2X/week      PT Plan Current plan remains appropriate    Co-evaluation  AM-PAC PT "6 Clicks" Daily Activity  Outcome Measure  Difficulty turning over in bed (including adjusting bedclothes, sheets and blankets)?: Unable Difficulty moving from lying on back to sitting on the side of the bed? : Unable Difficulty sitting down on and standing up from a chair with arms (e.g., wheelchair, bedside commode, etc,.)?: Unable Help needed moving to and from a bed to chair (including a wheelchair)?: Total Help needed walking in hospital room?: Total Help needed climbing 3-5 steps with a railing? : Total 6 Click Score: 6    End of Session Equipment Utilized During Treatment: Gait belt Activity Tolerance: Patient tolerated treatment well;Patient limited by fatigue Patient left: in bed;with call bell/phone within reach;with  family/visitor present;with bed alarm set   PT Visit Diagnosis: Muscle weakness (generalized) (M62.81);History of falling (Z91.81);Difficulty in walking, not elsewhere classified (R26.2)     Time: 1610-9604 PT Time Calculation (min) (ACUTE ONLY): 26 min  Charges:  $Therapeutic Activity: 23-37 mins                    G CodesDrucilla Patterson, PT Pager: [phone] 03/16/2018    Wendy Patterson 03/16/2018, 2:05 PM

## 2018-03-16 NOTE — Progress Notes (Addendum)
LCSW following for SNF placement..  Patient and family chose facility [facility] in [place], [place].   Using [facility]. Already has payment info. Please sent up transportation in advance.   LCSW will continue to follow for dc needs.   Beulah Gandy Garfield Long CSW [phone]

## 2018-03-17 ENCOUNTER — Inpatient Hospital Stay (HOSPITAL_COMMUNITY): Payer: Medicare Other

## 2018-03-17 DIAGNOSIS — A419 Sepsis, unspecified organism: Principal | ICD-10-CM

## 2018-03-17 LAB — CBC WITH DIFFERENTIAL/PLATELET
BASOS ABS: 0 10*3/uL (ref 0.0–0.1)
Basophils Relative: 0 %
EOS PCT: 1 %
Eosinophils Absolute: 0.2 10*3/uL (ref 0.0–0.7)
HCT: 26.1 % — ABNORMAL LOW (ref 36.0–46.0)
Hemoglobin: 8.7 g/dL — ABNORMAL LOW (ref 12.0–15.0)
LYMPHS ABS: 1.3 10*3/uL (ref 0.7–4.0)
LYMPHS PCT: 9 %
MCH: 35.8 pg — AB (ref 26.0–34.0)
MCHC: 33.3 g/dL (ref 30.0–36.0)
MCV: 107.4 fL — AB (ref 78.0–100.0)
MONO ABS: 1.3 10*3/uL — AB (ref 0.1–1.0)
MONOS PCT: 10 %
Neutro Abs: 10.5 10*3/uL — ABNORMAL HIGH (ref 1.7–7.7)
Neutrophils Relative %: 80 %
Platelets: 139 10*3/uL — ABNORMAL LOW (ref 150–400)
RBC: 2.43 MIL/uL — ABNORMAL LOW (ref 3.87–5.11)
RDW: 13.5 % (ref 11.5–15.5)
WBC: 13.3 10*3/uL — ABNORMAL HIGH (ref 4.0–10.5)

## 2018-03-17 LAB — PHOSPHORUS: PHOSPHORUS: 3.2 mg/dL (ref 2.5–4.6)

## 2018-03-17 LAB — COMPREHENSIVE METABOLIC PANEL
ALT: 14 U/L (ref 14–54)
ANION GAP: 13 (ref 5–15)
AST: 24 U/L (ref 15–41)
Albumin: 2.1 g/dL — ABNORMAL LOW (ref 3.5–5.0)
Alkaline Phosphatase: 64 U/L (ref 38–126)
BUN: 22 mg/dL — ABNORMAL HIGH (ref 6–20)
CALCIUM: 8.2 mg/dL — AB (ref 8.9–10.3)
CHLORIDE: 100 mmol/L — AB (ref 101–111)
CO2: 26 mmol/L (ref 22–32)
Creatinine, Ser: 1.1 mg/dL — ABNORMAL HIGH (ref 0.44–1.00)
GFR, EST AFRICAN AMERICAN: 52 mL/min — AB (ref >60)
GFR, EST NON AFRICAN AMERICAN: 45 mL/min — AB (ref >60)
Glucose, Bld: 117 mg/dL — ABNORMAL HIGH (ref 65–99)
POTASSIUM: 2.8 mmol/L — AB (ref 3.5–5.1)
Sodium: 139 mmol/L (ref 135–145)
TOTAL PROTEIN: 5.4 g/dL — AB (ref 6.5–8.1)
Total Bilirubin: 0.6 mg/dL (ref 0.3–1.2)

## 2018-03-17 LAB — MAGNESIUM: MAGNESIUM: 1.4 mg/dL — AB (ref 1.7–2.4)

## 2018-03-17 MED ORDER — POTASSIUM CHLORIDE CRYS ER 20 MEQ PO TBCR
40.0000 meq | EXTENDED_RELEASE_TABLET | Freq: Two times a day (BID) | ORAL | Status: AC
  Administered 2018-03-17: 40 meq via ORAL
  Filled 2018-03-17: qty 2

## 2018-03-17 MED ORDER — RESOURCE INSTANT PROTEIN PO PWD PACKET
6.0000 g | Freq: Three times a day (TID) | ORAL | Status: DC
  Filled 2018-03-17 (×5): qty 6

## 2018-03-17 MED ORDER — MAGNESIUM SULFATE 2 GM/50ML IV SOLN
2.0000 g | Freq: Once | INTRAVENOUS | Status: AC
  Administered 2018-03-17: 2 g via INTRAVENOUS
  Filled 2018-03-17: qty 50

## 2018-03-17 MED ORDER — AMOXICILLIN-POT CLAVULANATE 250-62.5 MG/5ML PO SUSR
500.0000 mg | Freq: Two times a day (BID) | ORAL | Status: DC
  Administered 2018-03-17 – 2018-03-18 (×3): 500 mg via ORAL
  Filled 2018-03-17 (×4): qty 10

## 2018-03-17 MED ORDER — BENEPROTEIN PO POWD
1.0000 | Freq: Three times a day (TID) | ORAL | Status: DC
  Administered 2018-03-17: 6 g via ORAL
  Filled 2018-03-17: qty 227

## 2018-03-17 NOTE — Evaluation (Signed)
Occupational Therapy Evaluation Patient Details Name: Wendy Patterson MRN: [id number] DOB: 1930/12/06 Today's Date: 03/17/2018    History of Present Illness PHYLIS JAVED is a 84 y.o. female with medical history significant of multiple allergies, chronic diastolic heart failure, paroxysmal atrial fibrillation adm through ED after a fall and found to have AKI, elevated lactate and elevated white blood cell count concerning for sepsis.   Clinical Impression   Pt was admitted for the above. She was limited by pain and low K+ today.  Saw for initial eval at bed level. She has been having difficulty with self feeding due to tremor.  Issued and tried weighted cup which pt found helpful  Will try weighted spoon, next.  Meal was not present twice when OT checked.  Will follow in acute setting with the goals below.     Follow Up Recommendations  SNF    Equipment Recommendations  3 in 1 bedside commode    Recommendations for Other Services       Precautions / Restrictions Precautions Precautions: Fall Restrictions Weight Bearing Restrictions: No      Mobility Bed Mobility               General bed mobility comments: not performed due to pain  Transfers                 General transfer comment: not performed due to pain and 2.8 K+    Balance                                           ADL either performed or assessed with clinical judgement   ADL Overall ADL's : Needs assistance/impaired Eating/Feeding: Moderate assistance;Bed level   Grooming: Minimal assistance;Bed level   Upper Body Bathing: Minimal assistance;Bed level   Lower Body Bathing: Maximal assistance;Sit to/from stand   Upper Body Dressing : Moderate assistance;Bed level   Lower Body Dressing: Total assistance;Bed level                 General ADL Comments: Pt seen at bed level.  Gave her a weighted lidded cup to for self feeding and educated on tucking elbows by sides to  help control tremor.  Brought a weighted spoon to try, but breakfast had not arrived. Returned 45 minutes later and still no breakfast. Called to reorder. Will check back, may be at another meal     Vision         Perception     Praxis      Pertinent Vitals/Pain Faces Pain Scale: Hurts whole lot Pain Location: neck Pain Descriptors / Indicators: Aching Pain Intervention(s): Limited activity within patient's tolerance;Monitored during session;Patient requesting pain meds-RN notified     Hand Dominance Right   Extremity/Trunk Assessment Upper Extremity Assessment Upper Extremity Assessment: Generalized weakness(tremor present, which pt states varies in severity)       Cervical / Trunk Assessment Cervical / Trunk Assessment: Kyphotic   Communication Communication Communication: No difficulties   Cognition Arousal/Alertness: Awake/alert Behavior During Therapy: WFL for tasks assessed/performed Overall Cognitive Status: Within Functional Limits for tasks assessed                                     General Comments       Exercises  Shoulder Instructions      Home Living Family/patient expects to be discharged to:: Skilled nursing facility Living Arrangements: Alone                               Additional Comments: pt plans to go to [facility] in [place], near children      Prior Functioning/Environment Level of Independence: Independent with assistive device(s)                 OT Problem List: Decreased strength;Decreased activity tolerance;Impaired balance (sitting and/or standing);Decreased coordination;Decreased knowledge of use of DME or AE;Pain      OT Treatment/Interventions: Self-care/ADL training;DME and/or AE instruction;Patient/family education;Balance training;Therapeutic activities    OT Goals(Current goals can be found in the care plan section) Acute Rehab OT Goals Patient Stated Goal: get strength back OT  Goal Formulation: With patient Time For Goal Achievement: 03/31/18 Potential to Achieve Goals: Good ADL Goals Pt Will Perform Eating: with set-up;bed level;with supervision;with adaptive utensils;sitting Pt Will Perform Grooming: with set-up;bed level;sitting Pt Will Perform Upper Body Bathing: with set-up;bed level;sitting Pt Will Perform Upper Body Dressing: with min assist;bed level;sitting Pt Will Transfer to Toilet: with mod assist;with +2 assist;bedside commode;stand pivot transfer  OT Frequency: Min 2X/week   Barriers to D/C:            Co-evaluation              AM-PAC PT "6 Clicks" Daily Activity     Outcome Measure Help from another person eating meals?: A Little Help from another person taking care of personal grooming?: A Little Help from another person toileting, which includes using toliet, bedpan, or urinal?: A Lot Help from another person bathing (including washing, rinsing, drying)?: A Lot Help from another person to put on and taking off regular upper body clothing?: A Lot Help from another person to put on and taking off regular lower body clothing?: Total 6 Click Score: 13   End of Session Nurse Communication: Patient requests pain meds  Activity Tolerance: Patient limited by pain Patient left: in bed;with call bell/phone within reach;with bed alarm set  OT Visit Diagnosis: Muscle weakness (generalized) (M62.81)                Time: 1610-9604 OT Time Calculation (min): 19 min Charges:  OT General Charges $OT Visit: 1 Visit OT Evaluation $OT Eval Low Complexity: 1 Low G-Codes:     [place], OTR/L [phone] 03/17/2018  Padraic Marinos 03/17/2018, 9:23 AM

## 2018-03-17 NOTE — Progress Notes (Signed)
  Speech Language Pathology Treatment: Dysphagia  Patient Details Name: Wendy Patterson MRN: [id number] DOB: [birth year] Today's Date: 03/17/2018 Time: 8657-8469 SLP Time Calculation (min) (ACUTE ONLY): 20 min  Assessment / Plan / Recommendation Clinical Impression  Pt was seen at bedside, following up after diet change yesterday. OT had evaluated and provided weighted cup and spoon to facilitate self-feeding with tremors. Pt was seated upright and observed with scrambled eggs and thin liquids via cup sip. No overt s/s aspiration observed with either consistency. Pt/family were encouraged to take safe swallow precautions at DC, which is currently scheduled for tomorrow. Recommend ST follow up at next venue for education and carryover of strategies.    HPI HPI: Pt is an 84 y.o. female with PMH of multiple allergies, chronic diastolic heart failure, paroxysmal atrial fibrillation who comes into the emergency department after a fall and found to have AKI, elevated lactate and elevated white blood cell count concerning for sepsis, concern for EtOH withdrawal. Patient is a very poor historian. She reports that she fell while walking between rooms. Nutrition following - noted pt has tremor which affects ability to cut items well/ feeding difficulty. CXR negative for acute findings. Bedside swallow eval ordered.      SLP Plan  Continue with current plan of care       Recommendations  Diet recommendations: Dysphagia 2 (fine chop);Thin liquid Liquids provided via: No straw;Cup Medication Administration: Whole meds with liquid Supervision: Patient able to self feed;Staff to assist with self feeding Compensations: Slow rate;Small sips/bites;Follow solids with liquid;Minimize environmental distractions Postural Changes and/or Swallow Maneuvers: Seated upright 90 degrees;Upright 30-60 min after meal                Oral Care Recommendations: Oral care BID Follow up Recommendations: Skilled Nursing  facility SLP Visit Diagnosis: Dysphagia, oropharyngeal phase (R13.12) Plan: Continue with current plan of care       GO               Wendy Patterson B. [facility], CCC-SLP Speech Language Pathologist [phone]  Wendy Patterson 03/17/2018, 10:38 AM

## 2018-03-17 NOTE — Progress Notes (Signed)
CSW following to assist with discharge planning to [facility] in [place] SNF.   CSW informed by patient's MD and RNCM, Patient not medically stable for discharge.  CSW contacted [facility] SN[facility] staff member Herbert Seta to provide update, no answer. CSW left voicemail requesting return phone call.   CSW contacted patient's daughter Lurlean Horns [phone]) and provided update.  CSW contacted by [facility] SN[facility] staff member [place]. CSW updated staff that patient was not medically stable for discharge. Staff reported that they may still have a bed available for patient tomorrow. CSW agreed to update patient's daughter and follow up with SNF tomorrow.   CSW contacted patient's daughter Lurlean Horns [phone]) and provided update.   CSW will continue to follow and assist with discharge planning.  Celso Sickle, [place] Clinical Social Worker [facility] Cell#: [phone]

## 2018-03-17 NOTE — Progress Notes (Signed)
Nutrition Follow-up  DOCUMENTATION CODES:   Severe malnutrition in context of chronic illness, Underweight  INTERVENTION:  - Continue Juven BID. - Will order 1 packet Beneprotein TID, each packet provides 25 kcal and 6 grams of protein. - Continue to encourage PO intakes. - RD will continue to monitor for additional nutrition-related needs.  Monitor magnesium, potassium, and phosphorus daily for at least 3 days, MD to replete as needed, as pt is at risk for refeeding syndrome given severe malnutrition, likely poor intakes PTA with intakes possibly increased since admission, current hypokalemia and hypomagnesemia.   NUTRITION DIAGNOSIS:   Severe Malnutrition related to chronic illness as evidenced by severe fat depletion, severe muscle depletion. -ongoing  GOAL:   Patient will meet greater than or equal to 90% of their needs  -likely unmet on average.  MONITOR:   PO intake, Weight trends, Labs, Supplement acceptance  ASSESSMENT:   84 y.o. female with medical history significant of multiple allergies, CHF, atrial fibrillation. She came to the ED after a fall and found to have AKI, elevated lactate and elevated white blood cell count concerning for sepsis. Patient is a very poor historian. She reports that she fell approximately the day before admission with a mechanical fall while walking between rooms. At home she had to use a cane or walker intermittently. She reports falling and having severe pain everywhere, but it was particularly worse in her neck where she has chronic pain from cervical osteoarthritis.  No new weight since admission. Per chart review, she consumed 25% of lunch yesterday, 50% of breakfast and 75% of lunch on 5/12, and 100% of breakfast and late dinner on 5/11. No other intakes documented since RD assessment on 5/10. SLP has been following pt and diet was downgraded from Regular to Dysphagia 3, thin liquids on 5/11 and then to Dysphagia 2, thin liquids yesterday.     Per Dr. Elizabeth Palau note last night: pt is medically stable but is pending ability for [facility] to transfer her to SNF in [place], AKI with hx of stage 3 CKD, Foley placed this admission d/t acute urinary retention, alcohol use with concern for withdrawal, SIRS likely from aspiration PNA, afib with RVR, acute respiratory failure with hypoxia likely from aspiration and possibly from overhydration, acute on CHF, macrocytic anemia, thrombocytopenia, low lytes.   Medications reviewed; 100 mg Colace BID, 1 mg oral folic acid/day, 2 g IV Mg sulfate x1 run today, daily multivitamin with minerals, 40 mEq oral KCl x1 today, 100 mg oral vitamin B6/day, 1000 mcg oral vitamin B12/day, 50000 units Drisdol every 7 days. Labs reviewed; K: 2.8 mmol/L, Cl: 100 mmol/L, BUN: 22 mg/dL, creatinine: 1.1 mg/dL, Ca: 8.2 mg/dL, Mg: 1.4 mg/dL, GFR: 45 mL/min.     Diet Order:   Diet Order           DIET DYS 2 Room service appropriate? Yes; Fluid consistency: Thin  Diet effective now          EDUCATION NEEDS:   Education needs have been addressed  Skin:  Skin Assessment: Reviewed RN Assessment  Last BM:  5/14  Height:   Ht Readings from Last 1 Encounters:  03/11/18  (1.575 m)    Weight:   Wt Readings from Last 1 Encounters:  03/11/18 100 lb (45.4 kg)    Ideal Body Weight:  50 kg  BMI:  Body mass index is 18.29 kg/m.  Estimated Nutritional Needs:   Kcal:  1360-1590 (30-35 kcal/kg)  Protein:  55-65 grams  Fluid:  >/=  1.7 L/day      Trenton Gammon, MS, RD, LDN, [facility] Inpatient Clinical Dietitian Pager # [phone] After hours/weekend pager # [phone]

## 2018-03-17 NOTE — Progress Notes (Signed)
PROGRESS NOTE  Wendy Patterson ZOX:[id number] DOB: [birth year] DOA: 03/11/2018 PCP: Kaleen Mask, MD  HPI/Recap of past 24 hours: Wendy Patterson a 84 y.o.femalewith medical history significant ofmultiple allergies, chronic diastolic heart failure, paroxysmal atrial fibrillation who comes into the emergency department after a fall and found to have AKI, elevated lactate and elevated white blood cell count concerning for sepsis. Of note patient's ethanol level was noted to be 14. While being evaluated patient had a brief a symptomatic run of A. fib with RVR with heart rate in the 170s. Pt admitted for fall, concern for EtOH Withdrawal and sepsis. Family wants to take her to skilled nursing facility in [place] and discussed with social worker who stated that it will take 2 days advance notice for transportation to take her to [place] so cannot be discharged until Thursday 03/18/18.  Today, patient denies any new symptoms, eager to be discharged to SNF.  Denies any chest pain, shortness of breath, fever/chills, abdominal pain.   Assessment/Plan: Principal Problem:   AKI (acute kidney injury) (HCC) Active Problems:   Allergic rhinitis   Afib (HCC)   GERD (gastroesophageal reflux disease)   Fall at home, initial encounter   Sepsis [facility])   Atrial fibrillation with rapid ventricular response (HCC)   Protein-calorie malnutrition, severe   Elevated lipase   Fall   Leukocytosis   SIRS (systemic inflammatory response syndrome) (HCC)   Sepsis likley from ??aspiration PNA Afebrile, with downtrending leukocytosis Procalcitonin went from 0.17 -> 0.23 -> 0.28 -> 0.13 LA was 3.04 and trended down to 2.58 Repeat CXR showed: Fairly stable appearance of the chest. Bilateral perihilar interstitial infiltrates. Bibasilar densities Urinalysis neg for UTI Blood cultures, NGTD Descalated to p.o. Augmentin for a total of 7 days Repeat SLP evaluation showed the patient had dysphagia 2 diet  with thin liquids  Acute hypoxic respiratory failure likely from ?Aspiration  Still requiring supplemental O2 via [place] Added Flutter Valve, Incentive Spirometry Continue Xopenex/Atrovent Scheduled  Continue management as above  Paroxysmal Atrial Fibrillation with RVR Resolved Patient apparently has a known history of paroxysmal atrial fibrillation however she opted not to pursue anticoagulation and was just on aspirin which was subsequently removed due to GI upset. Echocardiogram showed EF of 55 to 60% with grade 1 diastolic dysfunction and essentially no change since 2017 Increase metoprolol tartrate 12.5 mg twice daily to 25 mg twice daily  Fall Unclear if mechanical Vs alcohol related Vs afib Physical Therapy consult recommending SNF  AKI on CKD Stage 3 Stable Likely 2/2 diminished p.o. Intake Vs sepsis Daily BMP  Acute Urinary Retention Foley Catheter Placed and a trial of void was done however patient was unable to void independently so Foley catheter had to be replaced Patient will need to follow-up with urology as an outpatient  Alcohol Use/Abuse with Concern for Withdrawl Unclear precisely how much she is drinking, ??elevated MCV suggesting at least some degree of alcoholism  AST was slightly elevated but now improved at 34 CIWA with IV Lorazepam per protocol Continue with Multivitamin, Folic acid and Thiamine  Acute on Chronic Diastolic Heart Failure Stable Echocardiogram: EF of 55 to 60% with grade 1 diastolic dysfunction Continue Home Bumetanide 2 mg daily  Continue to Monitor Strict I's/O's  Chronic Neck Pain Due to Osteoarthritis of the cervical spine. C/w tylenol, tramadol  Severe protein calorie malnutrition in the context of chronic illness Nutritionist consulted and appreciate recommendations  Macrocytic Anemia Iron panel showed iron level of 129, TIBC of  88, TIBC of 217, saturation ratio of 59%, Ferritin level of 367, Folate level of 19.3, and  Vitamin B12 level of 1072  Thrombocytopenia Stable, no signs of bleeding CBC in AM  Hypomagnesemia Replace prn     Code Status: Full  Family Communication: None at bedside  Disposition Plan: SNF by 03/18/18   Consultants:  None  Procedures:  None   Antimicrobials:  PO Augmentin  DVT prophylaxis:  Lovenox   Objective: Vitals:   03/16/18 2217 03/17/18 0526 03/17/18 0933 03/17/18 1356  BP: (!) 126/59 135/65  138/67  Pulse: 92 85  66  Resp: (!) Temp: 97.8 F (36.6 C) 98.4 F (36.9 C)  98.1 F (36.7 C)  TempSrc: Oral Oral  Oral  SpO2: 92% 92% (!) 85% 100%  Weight:      Height:        Intake/Output Summary (Last 24 hours) at 03/17/2018 1829 Last data filed at 03/17/2018 1819 Gross per 24 hour  Intake 340 ml  Output 1375 ml  Net -1035 ml   Filed Weights   03/11/18 1451  Weight: 45.4 kg (100 lb)    Exam:   General: NAD, thin, frail   Cardiovascular: S1-S2 present, irregular  Respiratory: Diminished breath sounds bilaterally  Abdomen: Soft, nontender, nondistended, bowel sounds present  Musculoskeletal: Trace pedal edema bilaterally  Skin: Normal, multiple bruising noted  Psychiatry: Normal mood   Data Reviewed: CBC: Recent Labs  Lab 03/13/18 0947 03/14/18 0742 03/15/18 0609 03/16/18 0525 03/17/18 0454  WBC 20.3* 15.4* 14.8* 15.1* 13.3*  NEUTROABS 18.7* 13.9* 12.8* 13.0* 10.5*  HGB 10.7* 9.3* 10.5* 8.5* 8.7*  HCT 31.6* 27.3* 31.0* 24.9* 26.1*  MCV 106.0* 104.6* 104.7* 106.4* 107.4*  PLT 139* 125* 108* 129* 139*   Basic Metabolic Panel: Recent Labs  Lab 03/13/18 0947 03/14/18 0538 03/15/18 0609 03/16/18 0525 03/17/18 0454  NA 134* 134* 138 139 139  K 4.2 3.1* 3.7 4.4 2.8*  CL 107 102 102 101 100*  CO2 14* 20* GLUCOSE 83 186* 125* 104* 117*  BUN 38* 33* 25* 23* 22*  CREATININE 1.38* 1.18* 1.09* 1.10* 1.10*  CALCIUM 8.2* 8.3* 8.4* 8.3* 8.2*  MG 1.2* 2.2 1.6* 1.8 1.4*  PHOS 3.5 2.6 1.9* 3.1 3.2     GFR: Estimated Creatinine Clearance: 27.3 mL/min (A) (by C-G formula based on SCr of 1.1 mg/dL (H)). Liver Function Tests: Recent Labs  Lab 03/13/18 0947 03/14/18 0538 03/15/18 0609 03/16/18 0525 03/17/18 0454  AST 66* 32 25 34 24  ALT ALKPHOS 71 65 66 61 64  BILITOT 1.1 0.9 0.7 1.2 0.6  PROT 6.1* 5.5* 5.5* 5.3* 5.4*  ALBUMIN 2.7* 2.5* 2.4* 2.1* 2.1*   Recent Labs  Lab 03/11/18 1522 03/12/18 0521 03/13/18 1609  LIPASE 160* 154* 32   No results for input(s): AMMONIA in the last 168 hours. Coagulation Profile: No results for input(s): INR, PROTIME in the last 168 hours. Cardiac Enzymes: Recent Labs  Lab 03/11/18 1522  CKTOTAL 196   BNP (last 3 results) No results for input(s): PROBNP in the last 8760 hours. HbA1C: No results for input(s): HGBA1C in the last 72 hours. CBG: No results for input(s): GLUCAP in the last 168 hours. Lipid Profile: No results for input(s): CHOL, HDL, LDLCALC, TRIG, CHOLHDL, LDLDIRECT in the last 72 hours. Thyroid Function Tests: No results for input(s): TSH, T4TOTAL, FREET4, T3FREE, THYROIDAB in the last 72 hours. Anemia Panel:  No results for input(s): VITAMINB12, FOLATE, FERRITIN, TIBC, IRON, RETICCTPCT in the last 72 hours. Urine analysis:    Component Value Date/Time   COLORURINE YELLOW 03/12/2018 0848   APPEARANCEUR HAZY (A) 03/12/2018 0848   LABSPEC 1.014 03/12/2018 0848   PHURINE 5.0 03/12/2018 0848   GLUCOSEU NEGATIVE 03/12/2018 0848   HGBUR SMALL (A) 03/12/2018 0848   BILIRUBINUR NEGATIVE 03/12/2018 0848   KETONESUR NEGATIVE 03/12/2018 0848   PROTEINUR NEGATIVE 03/12/2018 0848   NITRITE NEGATIVE 03/12/2018 0848   LEUKOCYTESUR TRACE (A) 03/12/2018 0848   Sepsis Labs: (procalcitonin:4,lacticidven:4)  ) Recent Results (from the past 240 hour(s))  Blood culture (routine x 2)     Status: None   Collection Time: 03/11/18  4:34 PM  Result Value Ref Range Status   Specimen Description   Final     BLOOD BLOOD RIGHT FOREARM Performed at [facility], [address] [place], [place] [address]    Special Requests   Final    BOTTLES DRAWN AEROBIC ONLY Blood Culture adequate volume Performed at [facility], [address], [place], [place] [address]    Culture   Final    NO GROWTH 5 DAYS Performed at [facility], [address], [place], [place] [address]    Report Status 03/16/2018 FINAL  Final  Blood culture (routine x 2)     Status: None   Collection Time: 03/11/18  7:30 PM  Result Value Ref Range Status   Specimen Description   Final    BLOOD LEFT FOREARM Performed at [facility], [address] [place], [place] [address]    Special Requests   Final    BOTTLES DRAWN AEROBIC ONLY Blood Culture adequate volume Performed at [facility], [address] [place], [place] [address]    Culture   Final    NO GROWTH 5 DAYS Performed at [facility], [address] [place], [place] [address]    Report Status 03/16/2018 FINAL  Final      Studies: Dg Chest Port 1 View  Result Date: 03/17/2018 CLINICAL DATA:  Shortness of breath. EXAM: PORTABLE CHEST 1 VIEW COMPARISON:  Radiograph of Mar 16, 2018. FINDINGS: Stable cardiomediastinal silhouette. No pneumothorax is noted. Stable bilateral upper lobe opacities are noted concerning for edema or inflammation. Stable left basilar atelectasis or infiltrate is noted with associated pleural effusion. Mild right pleural effusion is noted. Bony thorax is unremarkable. IMPRESSION: Stable bilateral upper lobe opacities are noted as described above. Stable left basilar atelectasis or infiltrate is noted with associated pleural effusion. Mild right pleural effusion is noted. Electronically Signed   By: Lupita Raider, M.D.   On: 03/17/2018 07:03    Scheduled Meds: . amoxicillin-clavulanate  500 mg Oral Q12H  . azelastine  1 spray Each Nare BID  .  bumetanide  2 mg Oral Daily  . docusate sodium  100 mg Oral BID  . enoxaparin (LOVENOX) injection  30 mg Subcutaneous Q24H  . fluticasone  1 spray Each Nare Daily  . folic acid  1 mg Oral Daily  . ipratropium  0.5 mg Nebulization BID  . levalbuterol  0.63 mg Nebulization BID  . lidocaine  1 patch Transdermal Daily  . metoprolol tartrate  25 mg Oral BID  . multivitamin with minerals  1 tablet Oral Daily  . nutrition supplement (JUVEN)  1 packet Oral BID BM  . protein supplement  6 g Oral TID WC  .  pyridOXINE  100 mg Oral Daily  . thiamine  100 mg Oral Daily  . timolol  1 drop Left Eye BID  . vitamin B-12  1,000 mcg Oral Daily  . Vitamin D (Ergocalciferol)  50,000 Units Oral Q7 days    Continuous Infusions:   LOS: 6 days     Briant Cedar, MD [facility]   If 7PM-7AM, please contact night-coverage [email or web address] Password [facility] 03/17/2018, 6:29 PM

## 2018-03-18 LAB — CBC WITH DIFFERENTIAL/PLATELET
Basophils Absolute: 0 10*3/uL (ref 0.0–0.1)
Basophils Relative: 0 %
Eosinophils Absolute: 0.1 10*3/uL (ref 0.0–0.7)
Eosinophils Relative: 1 %
HEMATOCRIT: 28.9 % — AB (ref 36.0–46.0)
Hemoglobin: 9.6 g/dL — ABNORMAL LOW (ref 12.0–15.0)
LYMPHS PCT: 6 %
Lymphs Abs: 0.6 10*3/uL — ABNORMAL LOW (ref 0.7–4.0)
MCH: 36 pg — AB (ref 26.0–34.0)
MCHC: 33.2 g/dL (ref 30.0–36.0)
MCV: 108.2 fL — AB (ref 78.0–100.0)
MONOS PCT: 8 %
Monocytes Absolute: 0.8 10*3/uL (ref 0.1–1.0)
NEUTROS ABS: 8.8 10*3/uL — AB (ref 1.7–7.7)
NEUTROS PCT: 85 %
Platelets: 148 10*3/uL — ABNORMAL LOW (ref 150–400)
RBC: 2.67 MIL/uL — AB (ref 3.87–5.11)
RDW: 13.7 % (ref 11.5–15.5)
WBC: 10.3 10*3/uL (ref 4.0–10.5)

## 2018-03-18 LAB — BASIC METABOLIC PANEL
Anion gap: 16 — ABNORMAL HIGH (ref 5–15)
BUN: 21 mg/dL — AB (ref 6–20)
CO2: 24 mmol/L (ref 22–32)
CREATININE: 1.19 mg/dL — AB (ref 0.44–1.00)
Calcium: 8 mg/dL — ABNORMAL LOW (ref 8.9–10.3)
Chloride: 99 mmol/L — ABNORMAL LOW (ref 101–111)
GFR calc Af Amer: 47 mL/min — ABNORMAL LOW (ref >60)
GFR calc non Af Amer: 41 mL/min — ABNORMAL LOW (ref >60)
Glucose, Bld: 103 mg/dL — ABNORMAL HIGH (ref 65–99)
POTASSIUM: 3.1 mmol/L — AB (ref 3.5–5.1)
SODIUM: 139 mmol/L (ref 135–145)

## 2018-03-18 LAB — MAGNESIUM: Magnesium: 1.5 mg/dL — ABNORMAL LOW (ref 1.7–2.4)

## 2018-03-18 MED ORDER — ADULT MULTIVITAMIN W/MINERALS CH: 1.0000 | ORAL_TABLET | Freq: Every day | ORAL | Status: AC

## 2018-03-18 MED ORDER — MUSCLE RUB 10-15 % EX CREA: 1.0000 "application " | TOPICAL_CREAM | CUTANEOUS | 0 refills | Status: AC | PRN

## 2018-03-18 MED ORDER — LIDOCAINE 5 % EX PTCH: 1.0000 | MEDICATED_PATCH | Freq: Every day | CUTANEOUS | 0 refills | Status: AC

## 2018-03-18 MED ORDER — POLYETHYLENE GLYCOL 3350 17 G PO PACK: 17.0000 g | PACK | Freq: Every day | ORAL | 0 refills | Status: AC

## 2018-03-18 MED ORDER — MUSCLE RUB 10-15 % EX CREA
1.0000 "application " | TOPICAL_CREAM | CUTANEOUS | Status: DC | PRN
  Filled 2018-03-18: qty 85

## 2018-03-18 MED ORDER — METOPROLOL TARTRATE 25 MG PO TABS: 25.0000 mg | ORAL_TABLET | Freq: Two times a day (BID) | ORAL | Status: AC

## 2018-03-18 MED ORDER — AMOXICILLIN-POT CLAVULANATE 250-62.5 MG/5ML PO SUSR: 500.0000 mg | Freq: Two times a day (BID) | ORAL | 0 refills | Status: AC

## 2018-03-18 MED ORDER — POTASSIUM CHLORIDE CRYS ER 20 MEQ PO TBCR
40.0000 meq | EXTENDED_RELEASE_TABLET | Freq: Once | ORAL | Status: AC
  Administered 2018-03-18: 40 meq via ORAL
  Filled 2018-03-18: qty 2

## 2018-03-18 MED ORDER — MAGNESIUM SULFATE 2 GM/50ML IV SOLN
2.0000 g | Freq: Once | INTRAVENOUS | Status: AC
  Administered 2018-03-18: 2 g via INTRAVENOUS
  Filled 2018-03-18: qty 50

## 2018-03-18 MED ORDER — TRAMADOL HCL 50 MG PO TABS: 25.0000 mg | ORAL_TABLET | Freq: Three times a day (TID) | ORAL | 0 refills | Status: AC | PRN

## 2018-03-18 MED ORDER — THIAMINE HCL 100 MG PO TABS: 100.0000 mg | ORAL_TABLET | Freq: Every day | ORAL | Status: AC

## 2018-03-18 MED ORDER — FOLIC ACID 1 MG PO TABS: 1.0000 mg | ORAL_TABLET | Freq: Every day | ORAL | Status: AC

## 2018-03-18 MED ORDER — TRAMADOL HCL 50 MG PO TABS: 25.0000 mg | ORAL_TABLET | Freq: Three times a day (TID) | ORAL | Status: DC | PRN

## 2018-03-18 NOTE — Care Management Note (Signed)
Case Management Note  Patient Details  Name: CORRA KAINE MRN: [id number] Date of Birth: 05-Jun-1931  Subjective/Objective:                    Action/Plan:d/c SNF.   Expected Discharge Date:  03/18/18               Expected Discharge Plan:  Skilled Nursing Facility  In-House Referral:     Discharge planning Services     Post Acute Care Choice:  Home Health Choice offered to:  Patient  DME Arranged:    DME Agency:     HH Arranged:    HH Agency:     Status of Service:  Completed, signed off  If discussed at [employer], dates discussed:    Additional Comments:  Lanier Clam, RN 03/18/2018, 11:13 AM

## 2018-03-18 NOTE — Discharge Summary (Signed)
Discharge Summary  Wendy Patterson ZOX:[id number] DOB: 1931-04-15  PCP: Kaleen Mask, MD  Admit date: 03/11/2018 Discharge date: 03/18/2018  Time spent: 45 mins  Recommendations for Outpatient Follow-up:  1. PCP   Discharge Diagnoses:  Active Hospital Problems   Diagnosis Date Noted  . AKI (acute kidney injury) (HCC) 03/11/2018  . Protein-calorie malnutrition, severe 03/12/2018  . Elevated lipase   . Fall   . Leukocytosis   . SIRS (systemic inflammatory response syndrome) (HCC)   . Fall at home, initial encounter 03/11/2018  . Sepsis (HCC) 03/11/2018  . Atrial fibrillation with rapid ventricular response (HCC) 03/11/2018  . Allergic rhinitis 08/22/2016  . Afib (HCC) 04/27/2015  . GERD (gastroesophageal reflux disease) 04/27/2015    Resolved Hospital Problems  No resolved problems to display.    Discharge Condition: Stable  Diet recommendation: Heart healthy  Vitals:   03/17/18 2130 03/18/18 0838  BP: 134/80   Pulse: 83 88  Resp:    Temp:    SpO2:  91%    History of present illness:  Wendy Patterson a 84 y.o.femalewith medical history significant ofmultiple allergies, chronic diastolic heart failure, paroxysmal atrial fibrillation who comes into the emergency department after a fall and found to have AKI, elevated lactate and elevated white blood cell count concerning for sepsis. Of note patient's ethanol level was noted to be 14. While being evaluated patient had a brief a symptomatic run of A. fib with RVR with heart rate in the 170s. Pt admitted for fall, concern for EtOH Withdrawal and sepsis. Family wants to take her to skilled nursing facility in [place] and discussed with social worker who stated that itwill take 2 days advance noticefor transportation to take her to [place] so cannot be discharged until Thursday 03/18/18.  Today, patient denies any new symptoms. Son and daughter at bedside. Denies any new complaints. Denies any chest pain,  shortness of breath, fever/chills, abdominal pain.   Hospital Course:  Principal Problem:   AKI (acute kidney injury) (HCC) Active Problems:   Allergic rhinitis   Afib (HCC)   GERD (gastroesophageal reflux disease)   Fall at home, initial encounter   Sepsis [facility])   Atrial fibrillation with rapid ventricular response (HCC)   Protein-calorie malnutrition, severe   Elevated lipase   Fall   Leukocytosis   SIRS (systemic inflammatory response syndrome) (HCC)   Sepsis likely from ??aspiration PNA Afebrile, with resolved leukocytosis Procalcitonin went from 0.17 -> 0.23 -> 0.28-> 0.13 LA was 3.04 and trended down to 2.58 Repeat CXR showed:Fairly stable appearance of the chest. Bilateral perihilar interstitial infiltrates. Bibasilar densities Urinalysis neg for UTI Blood cultures, NGTD Descalated to p.o. Augmentin for a total of 7 days, last dose on 03/21/18 Repeat SLP evaluation showed the patient had dysphagia 2 diet with thin liquids  Acute hypoxic respiratory failure likely from ?Aspiration  Still requiring supplemental O2 via [place], plan to taper  Added Flutter Valve, Incentive Spirometry  Paroxysmal Atrial Fibrillation with RVR Rate controlled Patient apparently has a known history of paroxysmal atrial fibrillation however she opted not to pursue anticoagulation and was just on aspirin which was subsequently removed due to GI upset. Echocardiogram showed EF of 55 to 60% with grade 1 diastolic dysfunction and essentially no change since 2017 Increase metoprolol tartrate 12.5 mg twice dailyto 25 mg twice daily  Fall Unclear if mechanical Vs alcohol related Vs afib Physical Therapy consult recommending SNF  AKI on CKD Stage 3 Stable Likely 2/2 diminished p.o. Intake Vs  sepsis  Acute Urinary Retention Foley Catheter Placedand a trial of void was done however patient was unable to void independently so Foley catheter had to be replaced May continue voiding  trial Patient will need to follow-up with urology as an outpatient  Alcohol Use/Abuse with Concern for Withdrawl Unclear precisely how much she is drinking, ??elevated MCV suggesting at least some degree of alcoholism  AST was slightly elevatedbut now improved at 34 Continue with Multivitamin, Folic acid and Thiamine  Acute on Chronic Diastolic Heart Failure Stable Echocardiogram: EF of 55 to 60% with grade 1 diastolic dysfunction Continue Home Bumetanide 2 mg daily  Continue to Monitor Strict I's/O's  Chronic Neck Pain Due to Osteoarthritis of the cervical spine. C/w tramadol  Severe protein calorie malnutrition in the context of chronic illness Nutritionist consulted and appreciate recommendations  Macrocytic Anemia Iron panel showed iron level of 129, TIBC of 88, TIBC of 217, saturation ratio of 59%, Ferritin level of 367, Folate level of 19.3, and Vitamin B12 level of 1072  Thrombocytopenia Stable, no signs of bleeding  Hypomagnesemia Replace prn      Procedures:  None  Consultations:  None  Discharge Exam: BP 134/80   Pulse 88   Temp 98.1 F (36.7 C) (Oral)   Resp 18   Ht  (1.575 m)   Wt 45.4 kg (100 lb)   SpO2 91%   BMI 18.29 kg/m   General: NAD, thin, frail Cardiovascular: S1-S2 present, irregular Respiratory: Diminished breath sounds bilaterally  Discharge Instructions You were cared for by a hospitalist during your hospital stay. If you have any questions about your discharge medications or the care you received while you were in the hospital after you are discharged, you can call the unit and asked to speak with the hospitalist on call if the hospitalist that took care of you is not available. Once you are discharged, your primary care physician will handle any further medical issues. Please note that NO REFILLS for any discharge medications will be authorized once you are discharged, as it is imperative that you return to your  primary care physician (or establish a relationship with a primary care physician if you do not have one) for your aftercare needs so that they can reassess your need for medications and monitor your lab values.  Discharge Instructions    Diet - low sodium heart healthy   Complete by:  As directed    Increase activity slowly   Complete by:  As directed      Allergies as of 03/18/2018      Reactions   Amlodipine Swelling   Caused significant leg edema    Oxycodone Hcl Nausea Only, Other (See Comments)   Dizziness and nausea    Sulfa Drugs Cross Reactors Other (See Comments)   Cause Asthma      Medication List    STOP taking these medications   ibuprofen 200 MG tablet Commonly known as:  ADVIL,MOTRIN     TAKE these medications   amoxicillin-clavulanate 250-62.5 MG/5ML suspension Commonly known as:  AUGMENTIN Take 10 mLs (500 mg total) by mouth every 12 (twelve) hours for 3 days.   Azelastine-Fluticasone 137-50 MCG/ACT Susp Commonly known as:  DYMISTA Place 1 puff into the nose 2 (two) times daily.   bumetanide 2 MG tablet Commonly known as:  BUMEX Take 2 mg by mouth daily.   estrogens (conjugated) 1.25 MG tablet Commonly known as:  PREMARIN Take 1.25 mg by mouth. Every Other Day  folic acid 1 MG tablet Commonly known as:  FOLVITE Take 1 tablet (1 mg total) by mouth daily. Start taking on:  03/19/2018   lidocaine 5 % Commonly known as:  LIDODERM Place 1 patch onto the skin daily. Remove & Discard patch within 12 hours or as directed by MD Start taking on:  03/19/2018   metoprolol tartrate 25 MG tablet Commonly known as:  LOPRESSOR Take 1 tablet (25 mg total) by mouth 2 (two) times daily.   multivitamin with minerals Tabs tablet Take 1 tablet by mouth daily. Start taking on:  03/19/2018   MUSCLE RUB 10-15 % Crea Apply 1 application topically as needed for muscle pain.   polyethylene glycol packet Commonly known as:  MIRALAX / GLYCOLAX Take 17 g by mouth  daily.   thiamine 100 MG tablet Take 1 tablet (100 mg total) by mouth daily. Start taking on:  03/19/2018   timolol 0.25 % ophthalmic solution Commonly known as:  TIMOPTIC Place 1 drop into the left eye 2 (two) times daily.   traMADol 50 MG tablet Commonly known as:  ULTRAM Take 0.5 tablets (25 mg total) by mouth every 8 (eight) hours as needed for moderate pain.   VITAMIN B12 PO Take 1 tablet by mouth daily.   VITAMIN B6 PO Take 1 tablet by mouth daily.   Vitamin D (Ergocalciferol) 50000 units Caps capsule Commonly known as:  DRISDOL Take 50,000 Units by mouth. Every Other Week      Allergies  Allergen Reactions  . Amlodipine Swelling    Caused significant leg edema   . Oxycodone Hcl Nausea Only and Other (See Comments)    Dizziness and nausea   . Sulfa Drugs Cross Reactors Other (See Comments)    Cause Asthma   Follow-up Information    Kaleen Mask, MD. Schedule an appointment as soon as possible for a visit in 1 week(s).   Specialty:  Family Medicine Contact information: [address] [place] [address] [phone]            The results of significant diagnostics from this hospitalization (including imaging, microbiology, ancillary and laboratory) are listed below for reference.    Significant Diagnostic Studies: Ct Abdomen Pelvis Wo Contrast  Result Date: 03/11/2018 CLINICAL DATA:  Fall with nausea and vomiting EXAM: CT ABDOMEN AND PELVIS WITHOUT CONTRAST TECHNIQUE: Multidetector CT imaging of the abdomen and pelvis was performed following the standard protocol without IV contrast. COMPARISON:  03/16/2017 FINDINGS: LOWER CHEST: Small foci of subsegmental atelectasis in the right lower lobe. HEPATOBILIARY: Normal hepatic contours and density. No intra- or extrahepatic biliary dilatation. Status post cholecystectomy. PANCREAS: Normal parenchymal contours without ductal dilatation. No peripancreatic fluid collection. SPLEEN: Normal.  ADRENALS/URINARY TRACT: --Adrenal glands: Normal. --Right kidney/ureter: No hydronephrosis, nephroureterolithiasis, perinephric stranding or solid renal mass. --Left kidney/ureter: No hydronephrosis, nephroureterolithiasis, perinephric stranding or solid renal mass. --Urinary bladder: Normal for degree of distention STOMACH/BOWEL: --Stomach/Duodenum: No hiatal hernia or other gastric abnormality. Normal duodenal course. --Small bowel: No dilatation or inflammation. --Colon: No focal abnormality. --Appendix: Not visualized. No right lower quadrant inflammation or free fluid. VASCULAR/LYMPHATIC: Atherosclerotic calcification is present within the non-aneurysmal abdominal aorta, without hemodynamically significant stenosis. No abdominal or pelvic lymphadenopathy. REPRODUCTIVE: Status post hysterectomy. No adnexal mass. MUSCULOSKELETAL. No bony spinal canal stenosis or focal osseous abnormality. OTHER: None. IMPRESSION: 1. No acute abdominal or pelvic abnormality. 2.  Aortic Atherosclerosis (ICD10-I70.0). Electronically Signed   By: Deatra Robinson M.D.   On: 03/11/2018 18:25   Dg  Lumbar Spine Complete  Result Date: 03/11/2018 CLINICAL DATA:  Hip pain EXAM: LUMBAR SPINE - COMPLETE 4+ VIEW COMPARISON:  CT abdomen dated 03/11/2018. FINDINGS: Stable levoscoliosis. No evidence of acute vertebral body subluxation. No acute or suspicious osseous abnormality. Degenerative changes again noted within the mid and lower cervical spine, mild to moderate in degree. Visualized paravertebral soft tissues are unremarkable. IMPRESSION: 1. No acute findings. 2. Stable scoliosis. 3. Degenerative changes within the mid and lower lumbar spine, mild to moderate in degree, likely related to the underlying chronic scoliosis. Comparing to the previous CT of 03/11/2018, there is at least some degree of osseous neural foramen narrowing on the LEFT at the L5-S1 level which could potentially cause nerve root impingement and LEFT lower extremity  radiculopathy. If clinically indicated, consider MRI to exclude associated nerve root impingement. Electronically Signed   By: Bary Richard M.D.   On: 03/11/2018 18:52   Ct Head Wo Contrast  Result Date: 03/11/2018 CLINICAL DATA:  Patient fell sometime yesterday and has been found on the floor since the fall. EXAM: CT HEAD WITHOUT CONTRAST CT CERVICAL SPINE WITHOUT CONTRAST TECHNIQUE: Multidetector CT imaging of the head and cervical spine was performed following the standard protocol without intravenous contrast. Multiplanar CT image reconstructions of the cervical spine were also generated. COMPARISON:  Radiographs of the cervical spine from 04/08/2017 FINDINGS: CT HEAD FINDINGS Brain: Age related involutional changes of the brain with mild-to-moderate small vessel ischemic disease of periventricular and subcortical white matter. No large vascular territory infarct, hemorrhage, midline shift or edema. No intra-axial mass nor extra-axial fluid collections. Vascular: No hyperdense vessel sign.  No unexpected calcifications. Skull: No acute skull fracture or significant scalp swelling. Sinuses/Orbits: Bilateral lens replacements. Mild ethmoid sinus mucosal thickening. Clear mastoids bilaterally. Other: None CT CERVICAL SPINE FINDINGS Alignment: Chronic facet mediated spondylolisthesis at multiple levels of the upper cervical spine with grade 1 spondylolisthesis of C3 on C4, C4 on C5 and C5 on C6, the greatest at C5-6 measuring 2 mm. Skull base and vertebrae: No acute fracture. No primary bone lesion or focal pathologic process. Soft tissues and spinal canal: No prevertebral fluid or swelling. No visible canal hematoma. Disc levels: Cervical spondylosis with multilevel disc flattening is identified mild from C2 through C5 and marked from C5 through C7, most severely at C6-7. No jumped or perched facets. Multilevel degenerative facet arthropathy with joint space narrowing sclerosis is identified of the cervical  spine. Uncinate spurring with uncovertebral joint osteoarthritis is noted on the right at C3-4, C4-5 and bilaterally at C5-6 and C6-7. Mild right-sided foraminal encroachment from C4 through C7. Upper chest: Sub segmental atelectasis and/or scarring at the apices. Other: Atherosclerosis of the carotid bifurcations bilaterally. IMPRESSION: 1. Age related involutional changes of the brain without acute intracranial abnormality. Chronic appearing small vessel ischemic disease. 2. Redemonstration of cervical spondylosis with multilevel grade 1 spondylolisthesis from C3 through C6, greatest at C5-6 measuring 2 mm. These are believed to mediated by degenerative facet arthropathy. 3. No acute cervical spine fracture. Electronically Signed   By: Tollie Eth M.D.   On: 03/11/2018 18:16   Ct Cervical Spine Wo Contrast  Result Date: 03/11/2018 CLINICAL DATA:  Patient fell sometime yesterday and has been found on the floor since the fall. EXAM: CT HEAD WITHOUT CONTRAST CT CERVICAL SPINE WITHOUT CONTRAST TECHNIQUE: Multidetector CT imaging of the head and cervical spine was performed following the standard protocol without intravenous contrast. Multiplanar CT image reconstructions of the cervical spine were also generated.  COMPARISON:  Radiographs of the cervical spine from 04/08/2017 FINDINGS: CT HEAD FINDINGS Brain: Age related involutional changes of the brain with mild-to-moderate small vessel ischemic disease of periventricular and subcortical white matter. No large vascular territory infarct, hemorrhage, midline shift or edema. No intra-axial mass nor extra-axial fluid collections. Vascular: No hyperdense vessel sign.  No unexpected calcifications. Skull: No acute skull fracture or significant scalp swelling. Sinuses/Orbits: Bilateral lens replacements. Mild ethmoid sinus mucosal thickening. Clear mastoids bilaterally. Other: None CT CERVICAL SPINE FINDINGS Alignment: Chronic facet mediated spondylolisthesis at multiple  levels of the upper cervical spine with grade 1 spondylolisthesis of C3 on C4, C4 on C5 and C5 on C6, the greatest at C5-6 measuring 2 mm. Skull base and vertebrae: No acute fracture. No primary bone lesion or focal pathologic process. Soft tissues and spinal canal: No prevertebral fluid or swelling. No visible canal hematoma. Disc levels: Cervical spondylosis with multilevel disc flattening is identified mild from C2 through C5 and marked from C5 through C7, most severely at C6-7. No jumped or perched facets. Multilevel degenerative facet arthropathy with joint space narrowing sclerosis is identified of the cervical spine. Uncinate spurring with uncovertebral joint osteoarthritis is noted on the right at C3-4, C4-5 and bilaterally at C5-6 and C6-7. Mild right-sided foraminal encroachment from C4 through C7. Upper chest: Sub segmental atelectasis and/or scarring at the apices. Other: Atherosclerosis of the carotid bifurcations bilaterally. IMPRESSION: 1. Age related involutional changes of the brain without acute intracranial abnormality. Chronic appearing small vessel ischemic disease. 2. Redemonstration of cervical spondylosis with multilevel grade 1 spondylolisthesis from C3 through C6, greatest at C5-6 measuring 2 mm. These are believed to mediated by degenerative facet arthropathy. 3. No acute cervical spine fracture. Electronically Signed   By: Tollie Eth M.D.   On: 03/11/2018 18:16   Dg Chest Port 1 View  Result Date: 03/17/2018 CLINICAL DATA:  Shortness of breath. EXAM: PORTABLE CHEST 1 VIEW COMPARISON:  Radiograph of Mar 16, 2018. FINDINGS: Stable cardiomediastinal silhouette. No pneumothorax is noted. Stable bilateral upper lobe opacities are noted concerning for edema or inflammation. Stable left basilar atelectasis or infiltrate is noted with associated pleural effusion. Mild right pleural effusion is noted. Bony thorax is unremarkable. IMPRESSION: Stable bilateral upper lobe opacities are noted as  described above. Stable left basilar atelectasis or infiltrate is noted with associated pleural effusion. Mild right pleural effusion is noted. Electronically Signed   By: Lupita Raider, M.D.   On: 03/17/2018 07:03   Dg Chest Port 1 View  Result Date: 03/16/2018 CLINICAL DATA:  Shortness of breath.  Possible aspiration. EXAM: PORTABLE CHEST 1 VIEW COMPARISON:  Chest x-ray of Mar 15, 2018 FINDINGS: The lungs are well-expanded. There are persistent increased perihilar densities bilaterally. The left hemidiaphragm is partially obscured. There is a small amount of fluid in the right lateral costophrenic angle. The heart is normal in size. The pulmonary vascularity is indistinct. IMPRESSION: Fairly stable appearance of the chest. Bilateral perihilar interstitial infiltrates. Bibasilar densities as described. Electronically Signed   By: David  [place] M.D.   On: 03/16/2018 10:07   Dg Chest Port 1 View  Result Date: 03/15/2018 CLINICAL DATA:  Shortness of breath EXAM: PORTABLE CHEST 1 VIEW COMPARISON:  03/14/2018 FINDINGS: Cardiac shadow is stable. Patchy perihilar infiltrates are again identified and roughly stable given some technical variations in the imaging. Left lower lobe atelectasis is again noted. No pneumothorax is seen. No sizable pleural effusion is noted. IMPRESSION: Stable perihilar infiltrates and left lower lobe  changes. Electronically Signed   By: Alcide Clever M.D.   On: 03/15/2018 07:04   Dg Chest Port 1 View  Result Date: 03/14/2018 CLINICAL DATA:  Shortness of breath, history atrial fibrillation, CHF, hypertension EXAM: PORTABLE CHEST 1 VIEW COMPARISON:  Portable exam 1047 hours compared to 03/11/2018 FINDINGS: Stable heart size mediastinal contours. BILATERAL perihilar infiltrates greater on RIGHT. Atelectasis versus consolidation LEFT lower lobe. Small RIGHT pleural effusion. No pneumothorax. Atherosclerotic calcification aorta. Bones unremarkable. IMPRESSION: Atelectasis versus  consolidation LEFT lower lobe. Patchy BILATERAL perihilar infiltrates which could represent edema or infection. Small RIGHT pleural effusion. Electronically Signed   By: Ulyses Southward M.D.   On: 03/14/2018 11:25   Dg Chest Port 1 View  Result Date: 03/11/2018 CLINICAL DATA:  Fall EXAM: PORTABLE CHEST 1 VIEW COMPARISON:  Chest x-ray dated 06/20/2016. FINDINGS: Heart size and mediastinal contours appear stable. Lungs appear grossly clear, difficult to definitively characterize due to mild patient motion artifact. No pleural effusion or pneumothorax seen. No osseous fracture or dislocation seen. IMPRESSION: No acute findings. Electronically Signed   By: Bary Richard M.D.   On: 03/11/2018 18:55   Dg Hand Complete Left  Result Date: 03/11/2018 CLINICAL DATA:  Pain EXAM: LEFT HAND - COMPLETE 3+ VIEW COMPARISON:  None. FINDINGS: Advanced changes of chronic degenerative erosive osteoarthritis at the second through fourth DIP joints. Associated mild subluxations of the first, third and fourth distal phalanx. No acute appearing cortical irregularity or osseous lesion. No fracture line or displaced fracture fragment. IMPRESSION: 1. No acute findings. 2. 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 Electronically Signed   By: Bary Richard M.D.   On: 03/11/2018 18:54   Dg Inject Diag/thera/inc Needle/cath/plc Epi/cerv/thor W/img  Result Date: 03/02/2018 CLINICAL DATA:  Persistent neck pain, worse on the left. FLUOROSCOPY TIME:  Radiation Exposure Index (as provided by the fluoroscopic device): 26.63 Fluoroscopy Time:  48 seconds Number of Acquired Images:  0 PROCEDURE: CERVICAL EPIDURAL INJECTION An interlaminar approach was performed on the left at L5-S1. A 20 gauge epidural needle was advanced using loss-of-resistance technique. DIAGNOSTIC EPIDURAL INJECTION Injection of Isovue-M 300 shows a good  epidural pattern with spread above and below the level of needle placement, primarily on the left. No vascular opacification is seen. THERAPEUTIC EPIDURAL INJECTION 1.5 ml of Kenalog 40 mixed with 1 ml of 1% Lidocaine and 2 ml of normal saline were then instilled. The procedure was well-tolerated, and the patient was discharged thirty minutes following the injection in good condition. IMPRESSION: Technically successful first epidural injection on the left at C7-T1. Electronically Signed   By: Marin Roberts M.D.   On: 03/02/2018 16:18   Dg Hip Unilat W Or Wo Pelvis 2-3 Views Right  Result Date: 03/11/2018 CLINICAL DATA:  Larey Seat sometime yesterday.  Leg pain. EXAM: DG HIP (WITH OR WITHOUT PELVIS) 2-3V RIGHT COMPARISON:  No comparison studies available. FINDINGS: Frontal pelvis shows demineralization without an acute fracture. SI joints and symphysis pubis unremarkable. AP and frog-leg lateral views of the right hip show no femoral neck fracture. IMPRESSION: Negative. Electronically Signed   By: Kennith Center M.D.   On: 03/11/2018 18:48    Microbiology: Recent Results (from the past 240 hour(s))  Blood culture (routine x 2)     Status: None   Collection Time: 03/11/18  4:34 PM  Result Value Ref Range Status   Specimen Description   Final  BLOOD BLOOD RIGHT FOREARM Performed at [facility], [address] [place], [place] [address]    Special Requests   Final    BOTTLES DRAWN AEROBIC ONLY Blood Culture adequate volume Performed at [facility], [address], [place], [place] [address]    Culture   Final    NO GROWTH 5 DAYS Performed at [facility], [address] [place], [place] [address]    Report Status 03/16/2018 FINAL  Final  Blood culture (routine x 2)     Status: None   Collection Time: 03/11/18  7:30 PM  Result Value Ref Range Status   Specimen Description   Final    BLOOD LEFT FOREARM Performed at [facility], [address] [place], [place] [address]    Special Requests   Final    BOTTLES DRAWN AEROBIC ONLY Blood Culture adequate volume Performed at [facility], [address] [place], [place] [address]    Culture   Final    NO GROWTH 5 DAYS Performed at [facility], [address], [place], [place] [address]    Report Status 03/16/2018 FINAL  Final     Labs: Basic Metabolic Panel: Recent Labs  Lab 03/13/18 0947 03/14/18 0538 03/15/18 0609 03/16/18 0525 03/17/18 0454 03/18/18 0524  NA 134* 134* 138 139 139 139  K 4.2 3.1* 3.7 4.4 2.8* 3.1*  CL 107 102 102 101 100* 99*  CO2 14* 20* GLUCOSE 83 186* 125* 104* 117* 103*  BUN 38* 33* 25* 23* 22* 21*  CREATININE 1.38* 1.18* 1.09* 1.10* 1.10* 1.19*  CALCIUM 8.2* 8.3* 8.4* 8.3* 8.2* 8.0*  MG 1.2* 2.2 1.6* 1.8 1.4* 1.5*  PHOS 3.5 2.6 1.9* 3.1 3.2  --    Liver Function Tests: Recent Labs  Lab 03/13/18 0947 03/14/18 0538 03/15/18 0609 03/16/18 0525 03/17/18 0454  AST 66* 32 25 34 24  ALT ALKPHOS 71 65 66 61 64  BILITOT 1.1 0.9 0.7 1.2 0.6  PROT 6.1* 5.5* 5.5* 5.3* 5.4*  ALBUMIN 2.7* 2.5* 2.4* 2.1* 2.1*   Recent Labs  Lab 03/11/18 1522 03/12/18 0521 03/13/18 1609  LIPASE 160* 154* 32   No results for input(s): AMMONIA in the last 168 hours. CBC: Recent Labs  Lab 03/14/18 0742 03/15/18 0609 03/16/18 0525 03/17/18 0454 03/18/18 0524  WBC 15.4* 14.8* 15.1* 13.3* 10.3  NEUTROABS 13.9* 12.8* 13.0* 10.5* 8.8*  HGB 9.3* 10.5* 8.5* 8.7* 9.6*  HCT 27.3* 31.0* 24.9* 26.1* 28.9*  MCV 104.6* 104.7* 106.4* 107.4* 108.2*  PLT 125* 108* 129* 139* 148*   Cardiac Enzymes: Recent Labs  Lab 03/11/18 1522  CKTOTAL 196   BNP: BNP (last 3 results) No results for input(s): BNP in the last 8760 hours.  ProBNP (last 3 results) No results for input(s): PROBNP in the last 8760 hours.  CBG: No results for input(s): GLUCAP in the last 168  hours.     Signed:  Briant Cedar, MD [facility] 03/18/2018, 10:24 AM

## 2018-03-18 NOTE — Clinical Social Work Placement (Signed)
Patient received and accepted bed offer at [facility].CSW sent discharge summary to SNF. Facility aware of patient's discharge and confirmed bed offer.[facility] contacted, patient's family notified. Patient's RN can call report to [phone], packet complete. CSW signing off, no other needs identified at this time.  CLINICAL SOCIAL WORK PLACEMENT  NOTE  Date:  03/18/2018  Patient Details  Name: Wendy Patterson MRN: [id number] Date of Birth: [birth year]  Clinical Social Work is seeking post-discharge placement for this patient at the Skilled  Nursing Facility level of care (*CSW will initial, date and re-position this form in  chart as items are completed):  Yes   Patient/family provided with [facility] Social Work Department's list of facilities offering this level of care within the geographic area requested by the patient (or if unable, by the patient's family).  Yes   Patient/family informed of their freedom to choose among providers that offer the needed level of care, that participate in Medicare, Medicaid or managed care program needed by the patient, have an available bed and are willing to accept the patient.  Yes   Patient/family informed of [place] [facility]'s ownership interest in [facility] and [facility], as well as of the fact that they are under no obligation to receive care at these facilities.  PASRR submitted to [employer] on       PASRR number received on 03/15/18     Existing PASRR number confirmed on       FL2 transmitted to all facilities in geographic area requested by pt/family on 03/15/18     FL2 transmitted to all facilities within larger geographic area on       Patient informed that his/her managed care company has contracts with or will negotiate with certain facilities, including the following:        Yes   Patient/family informed of bed offers received.  Patient chooses bed at ([facility])     Physician recommends and patient  chooses bed at      Patient to be transferred to ([facility]) on 03/18/18.  Patient to be transferred to facility by [facility]     Patient family notified on 03/18/18 of transfer.  Name of family member notified:  Jaci Ponzoni      PHYSICIAN       Additional Comment:    _______________________________________________ Antionette Poles, LCSW 03/18/2018, 11:39 AM

## 2018-07-04 DEATH — deceased

## 2018-10-01 IMAGING — DX DG CHEST 1V PORT
1 series · 1 of 1 positions shown · non-contrast
Comparison: Portable exam 9071 hours compared to 03/11/2018

CLINICAL DATA: Shortness of breath, history atrial fibrillation,
CHF, hypertension

EXAM:
PORTABLE CHEST 1 VIEW

[chest ap]
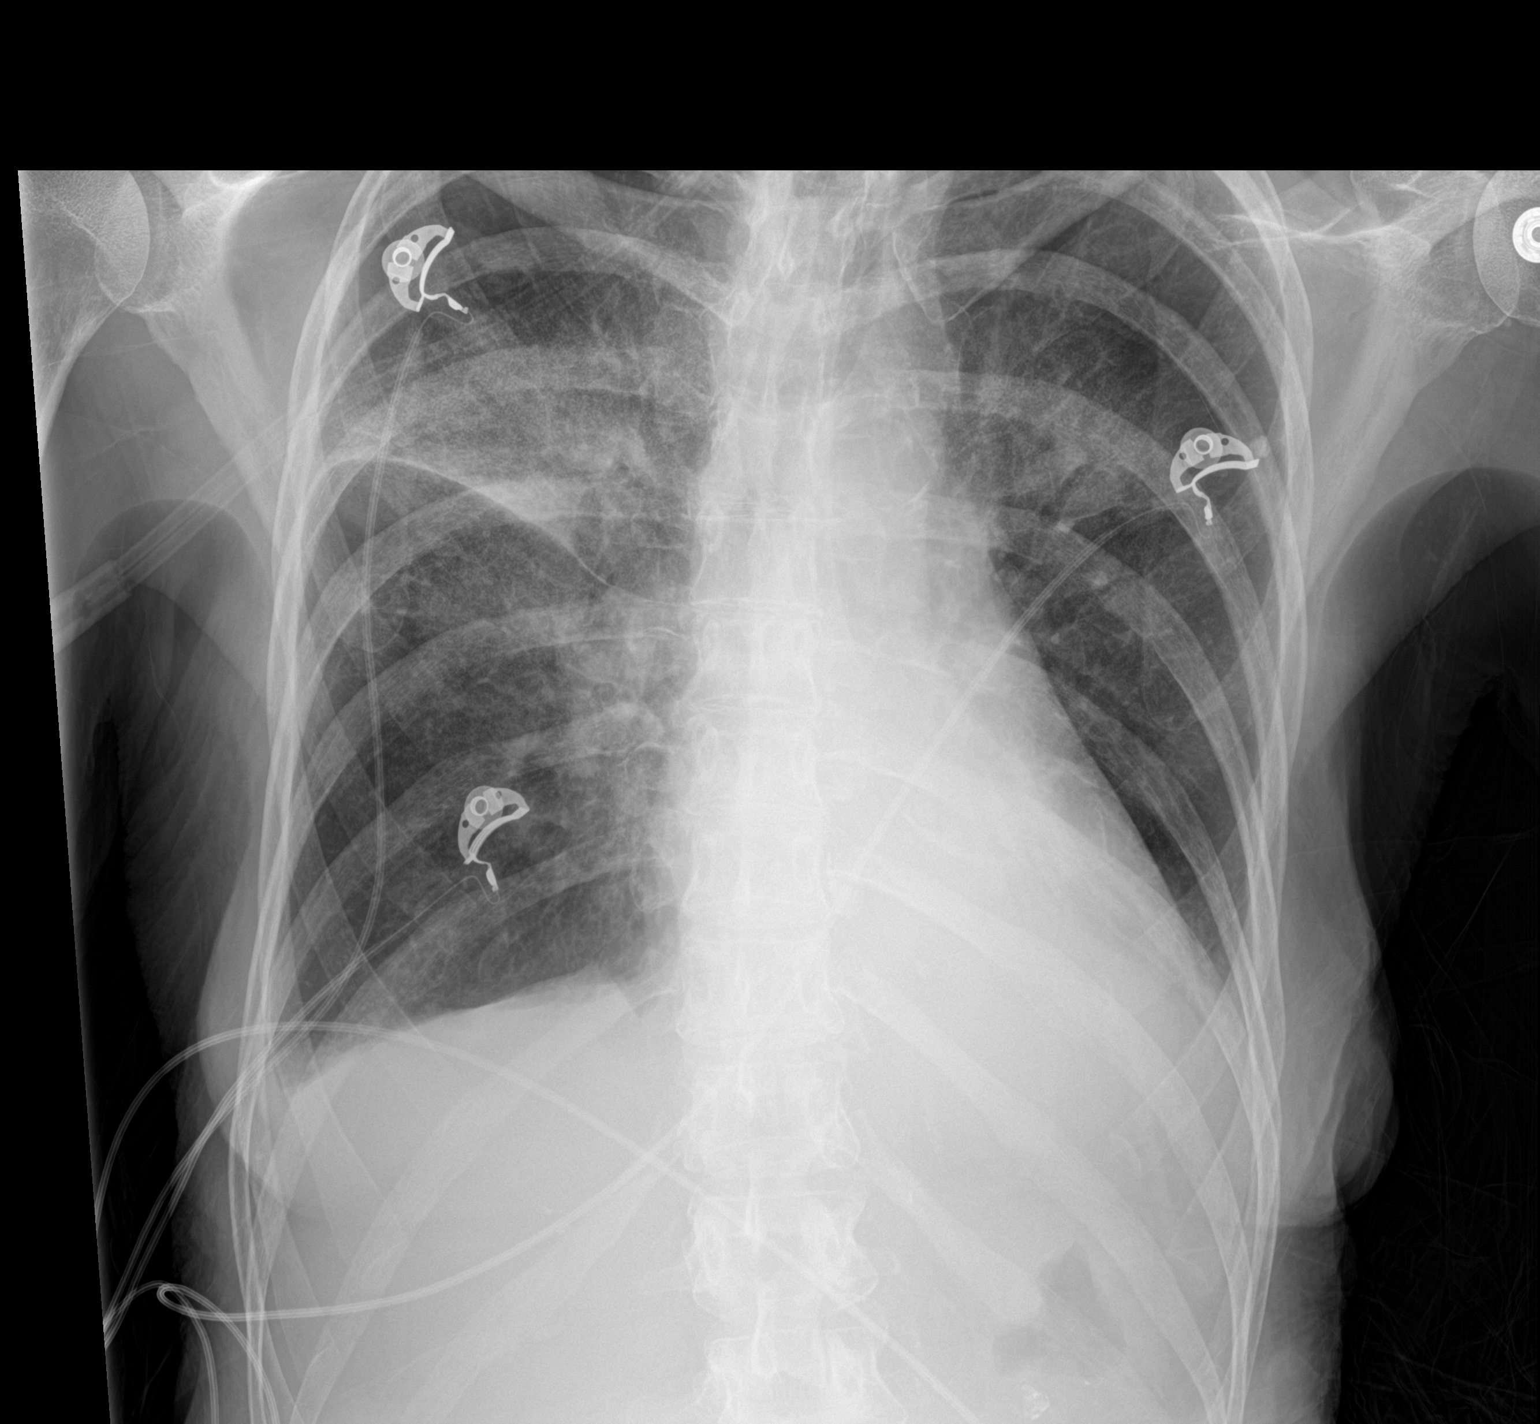

[1 of 1 positions shown; findings below may reference images not displayed]

FINDINGS: Stable heart size mediastinal contours.

BILATERAL perihilar infiltrates greater on RIGHT.

Atelectasis versus consolidation LEFT lower lobe.

Small RIGHT pleural effusion.

No pneumothorax.

Atherosclerotic calcification aorta.

Bones unremarkable.
IMPRESSION: Atelectasis versus consolidation LEFT lower lobe.

Patchy BILATERAL perihilar infiltrates which could represent edema
or infection.

Small RIGHT pleural effusion.

## 2018-10-02 IMAGING — DX DG CHEST 1V PORT
1 series · 1 of 1 positions shown · non-contrast
Comparison: 03/14/2018

CLINICAL DATA: Shortness of breath

EXAM:
PORTABLE CHEST 1 VIEW

[chest ap]
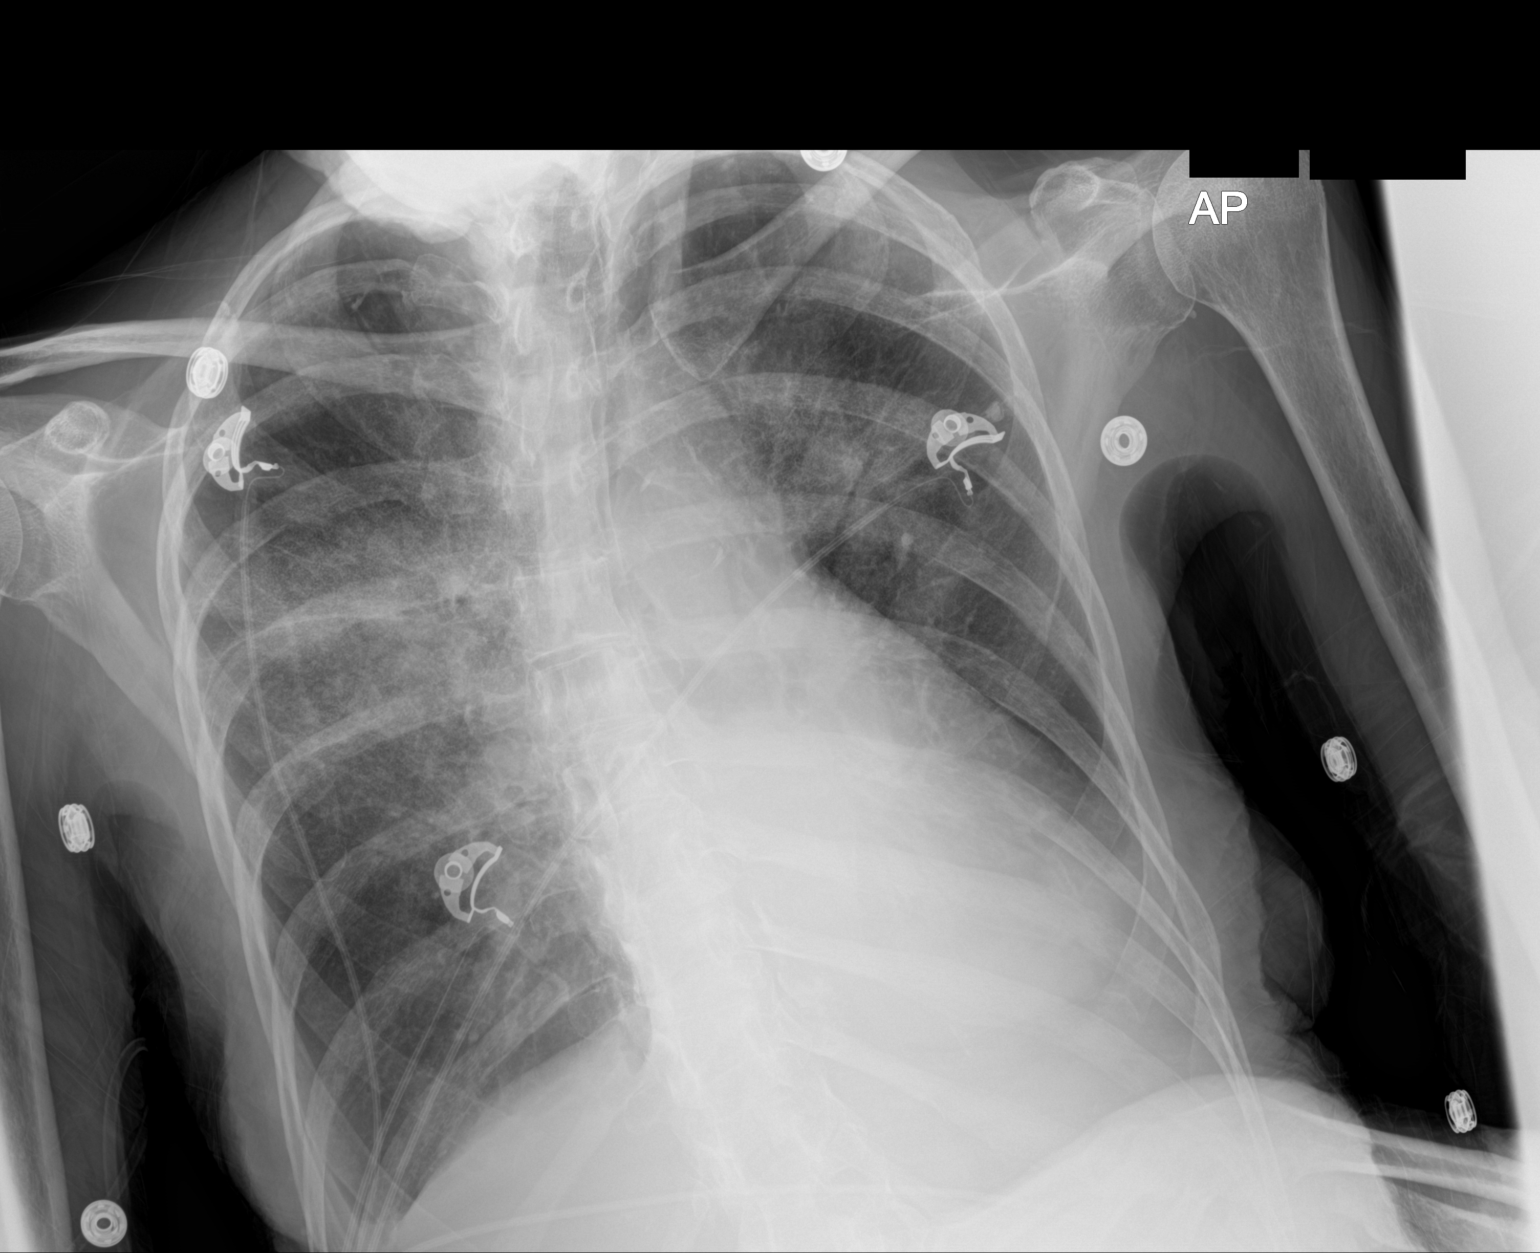

[1 of 1 positions shown; findings below may reference images not displayed]

FINDINGS: Cardiac shadow is stable. Patchy perihilar infiltrates are again
identified and roughly stable given some technical variations in the
imaging. Left lower lobe atelectasis is again noted. No pneumothorax
is seen. No sizable pleural effusion is noted.
IMPRESSION: Stable perihilar infiltrates and left lower lobe changes.

## 2018-10-04 IMAGING — DX DG CHEST 1V PORT
1 series · 1 of 1 positions shown · non-contrast
Comparison: Radiograph March 16, 2018.

CLINICAL DATA: Shortness of breath.

EXAM:
PORTABLE CHEST 1 VIEW

[chest ap]
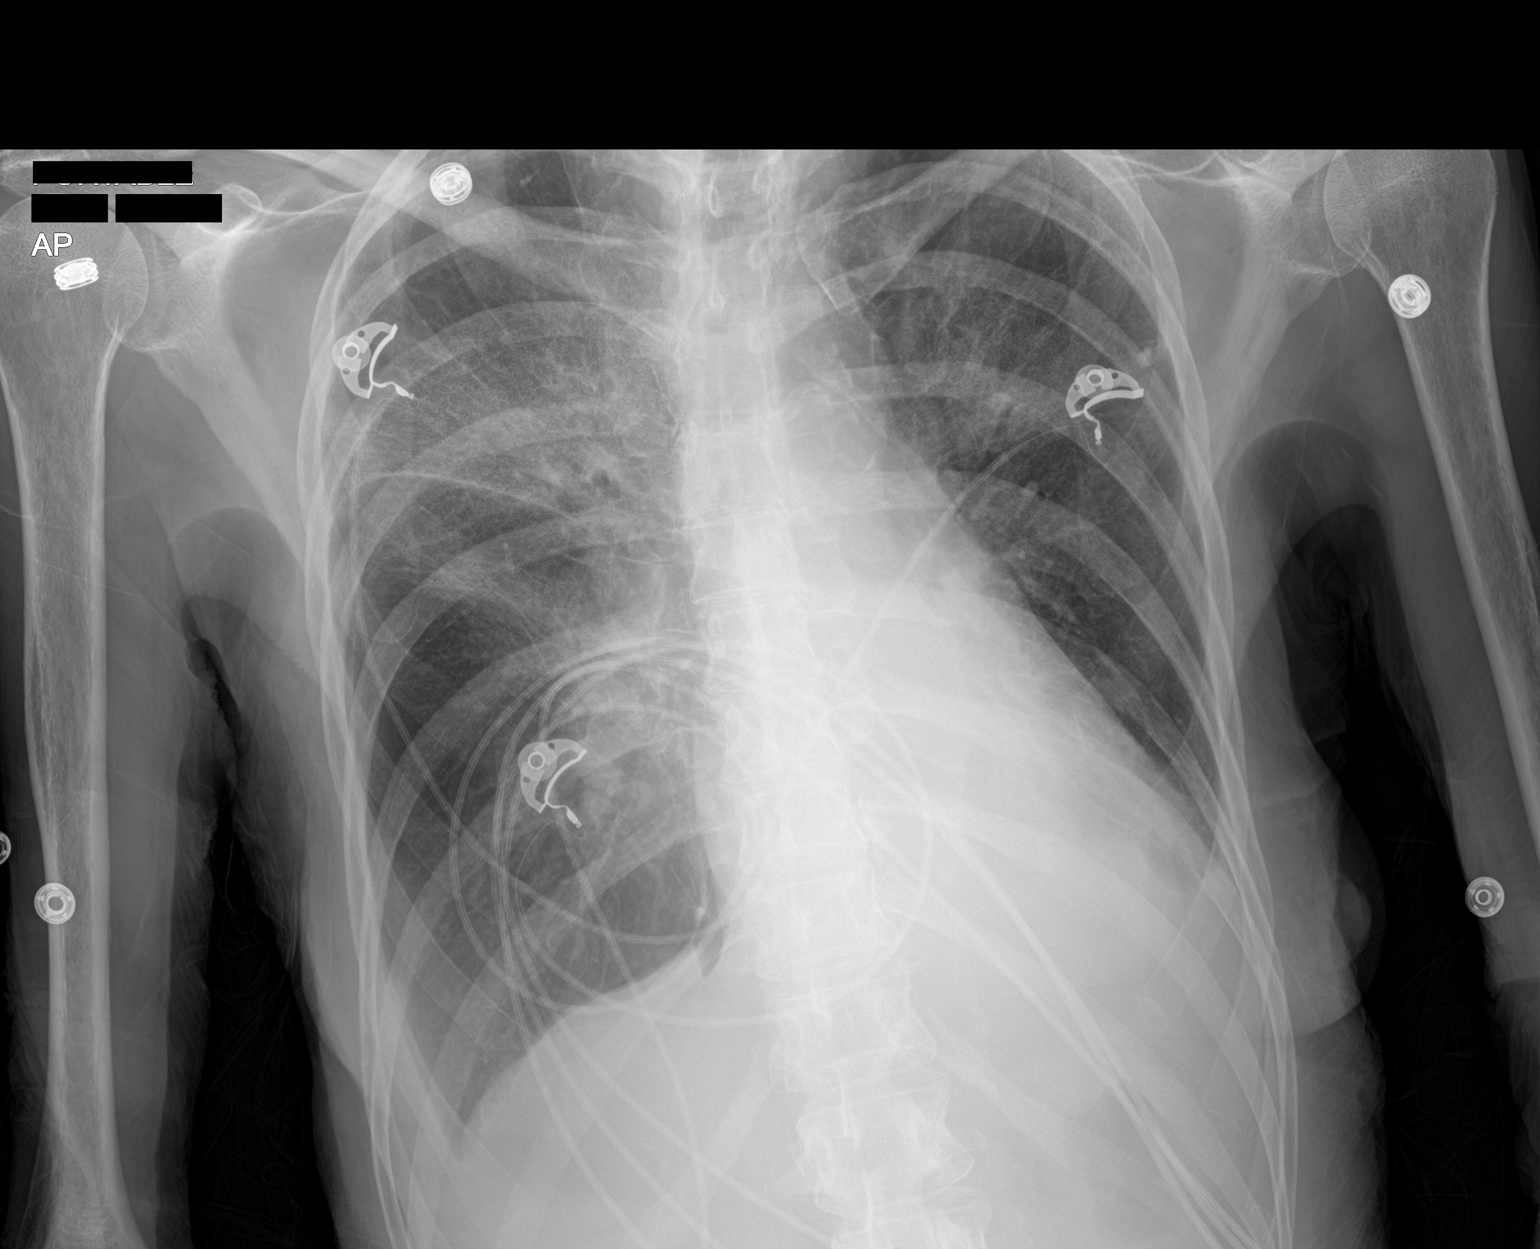

[1 of 1 positions shown; findings below may reference images not displayed]

FINDINGS: Stable cardiomediastinal silhouette. No pneumothorax is noted.
Stable bilateral upper lobe opacities are noted concerning for edema
or inflammation. Stable left basilar atelectasis or infiltrate is
noted with associated pleural effusion. Mild right pleural effusion
is noted. Bony thorax is unremarkable.
IMPRESSION: Stable bilateral upper lobe opacities are noted as described above.
Stable left basilar atelectasis or infiltrate is noted with
associated pleural effusion. Mild right pleural effusion is noted.
